# Patient Record
Sex: Male | Born: 1950
Health system: Southern US, Community
[De-identification: ages and names within clinical notes are randomized; demographics above are authoritative.]

## PROBLEM LIST (undated history)

## (undated) DIAGNOSIS — Z972 Presence of dental prosthetic device (complete) (partial): Secondary | ICD-10-CM

## (undated) DIAGNOSIS — E119 Type 2 diabetes mellitus without complications: Secondary | ICD-10-CM

## (undated) DIAGNOSIS — I639 Cerebral infarction, unspecified: Secondary | ICD-10-CM

## (undated) DIAGNOSIS — Z720 Tobacco use: Secondary | ICD-10-CM

## (undated) DIAGNOSIS — I10 Essential (primary) hypertension: Secondary | ICD-10-CM

## (undated) DIAGNOSIS — Z87891 Personal history of nicotine dependence: Secondary | ICD-10-CM

## (undated) HISTORY — DX: Personal history of nicotine dependence: Z87.891

## (undated) HISTORY — DX: Tobacco use: Z72.0

## (undated) HISTORY — PX: FRACTURE SURGERY: SHX138

---

## 2009-08-14 ENCOUNTER — Ambulatory Visit: Payer: Self-pay | Admitting: Internal Medicine

## 2009-08-14 ENCOUNTER — Ambulatory Visit: Payer: Self-pay | Admitting: Cardiology

## 2009-08-14 ENCOUNTER — Inpatient Hospital Stay (HOSPITAL_COMMUNITY): Admission: EM | Admit: 2009-08-14 | Discharge: 2009-08-17 | Payer: Self-pay | Admitting: Emergency Medicine

## 2009-08-15 ENCOUNTER — Encounter: Payer: Self-pay | Admitting: Internal Medicine

## 2009-08-17 ENCOUNTER — Encounter: Payer: Self-pay | Admitting: Internal Medicine

## 2010-05-31 LAB — COMPREHENSIVE METABOLIC PANEL WITH GFR
ALT: 13 U/L (ref 0–53)
AST: 15 U/L (ref 0–37)
Albumin: 3.5 g/dL (ref 3.5–5.2)
Alkaline Phosphatase: 77 U/L (ref 39–117)
BUN: 11 mg/dL (ref 6–23)
CO2: 27 meq/L (ref 19–32)
Calcium: 8.8 mg/dL (ref 8.4–10.5)
Chloride: 103 meq/L (ref 96–112)
Creatinine, Ser: 0.87 mg/dL (ref 0.4–1.5)
GFR calc non Af Amer: >60 mL/min
Glucose, Bld: 123 mg/dL — ABNORMAL HIGH (ref 70–99)
Potassium: 3.5 meq/L (ref 3.5–5.1)
Sodium: 134 meq/L — ABNORMAL LOW (ref 135–145)
Total Bilirubin: 0.6 mg/dL (ref 0.3–1.2)
Total Protein: 6.2 g/dL (ref 6.0–8.3)

## 2010-05-31 LAB — DIFFERENTIAL
Basophils Relative: 0 % (ref 0–1)
Lymphs Abs: 1.8 10*3/uL (ref 0.7–4.0)
Monocytes Absolute: 0.6 10*3/uL (ref 0.1–1.0)
Monocytes Relative: 6 % (ref 3–12)
Neutro Abs: 7.6 10*3/uL (ref 1.7–7.7)

## 2010-05-31 LAB — PROTIME-INR: Prothrombin Time: 13.9 seconds (ref 11.6–15.2)

## 2010-05-31 LAB — COMPREHENSIVE METABOLIC PANEL
Alkaline Phosphatase: 76 U/L (ref 39–117)
BUN: 10 mg/dL (ref 6–23)
Creatinine, Ser: 0.76 mg/dL (ref 0.4–1.5)
GFR calc non Af Amer: >60 mL/min (ref >60)
Potassium: 3.6 mEq/L (ref 3.5–5.1)
Sodium: 142 mEq/L (ref 135–145)
Total Protein: 6.5 g/dL (ref 6.0–8.3)

## 2010-05-31 LAB — URINALYSIS, ROUTINE W REFLEX MICROSCOPIC
Bilirubin Urine: NEGATIVE
Glucose, UA: NEGATIVE mg/dL
Hgb urine dipstick: NEGATIVE
Ketones, ur: NEGATIVE mg/dL
Nitrite: NEGATIVE
Protein, ur: NEGATIVE mg/dL
Specific Gravity, Urine: 1.01 (ref 1.005–1.030)
Urobilinogen, UA: 0.2 mg/dL (ref 0.0–1.0)
pH: 7 (ref 5.0–8.0)

## 2010-05-31 LAB — LIPID PANEL
HDL: 37 mg/dL — ABNORMAL LOW
Total CHOL/HDL Ratio: 4.5 ratio
Triglycerides: 156 mg/dL — ABNORMAL HIGH
VLDL: 31 mg/dL (ref 0–40)

## 2010-05-31 LAB — GLUCOSE, CAPILLARY
Glucose-Capillary: 102 mg/dL — ABNORMAL HIGH (ref 70–99)
Glucose-Capillary: 116 mg/dL — ABNORMAL HIGH (ref 70–99)
Glucose-Capillary: 120 mg/dL — ABNORMAL HIGH (ref 70–99)
Glucose-Capillary: 128 mg/dL — ABNORMAL HIGH (ref 70–99)
Glucose-Capillary: 139 mg/dL — ABNORMAL HIGH (ref 70–99)
Glucose-Capillary: 159 mg/dL — ABNORMAL HIGH (ref 70–99)

## 2010-05-31 LAB — CBC
Hemoglobin: 14.4 g/dL (ref 13.0–17.0)
MCHC: 35 g/dL (ref 30.0–36.0)
MCV: 89.6 fL (ref 78.0–100.0)
RDW: 13.5 % (ref 11.5–15.5)

## 2010-05-31 LAB — TROPONIN I

## 2010-05-31 LAB — CK TOTAL AND CKMB (NOT AT ARMC)
CK, MB: 2.3 ng/mL (ref 0.3–4.0)
Relative Index: 2.2 (ref 0.0–2.5)

## 2010-05-31 LAB — HEMOGLOBIN A1C
Hgb A1c MFr Bld: 7.1 % — ABNORMAL HIGH
Mean Plasma Glucose: 157 mg/dL — ABNORMAL HIGH

## 2010-05-31 LAB — APTT: aPTT: 28 seconds (ref 24–37)

## 2010-05-31 LAB — TSH: TSH: 4.949 u[IU]/mL — ABNORMAL HIGH (ref 0.350–4.500)

## 2011-04-21 ENCOUNTER — Emergency Department: Payer: Self-pay | Admitting: Internal Medicine

## 2011-04-21 LAB — COMPREHENSIVE METABOLIC PANEL
Alkaline Phosphatase: 73 U/L (ref 50–136)
BUN: 11 mg/dL (ref 7–18)
Calcium, Total: 8.8 mg/dL (ref 8.5–10.1)
Co2: 22 mmol/L (ref 21–32)
EGFR (Non-African Amer.): >60
Glucose: 132 mg/dL — ABNORMAL HIGH (ref 65–99)
Osmolality: 271 (ref 275–301)
SGPT (ALT): 28 U/L

## 2011-04-21 LAB — CBC
HCT: 39.5 % — ABNORMAL LOW (ref 40.0–52.0)
MCHC: 34.5 g/dL (ref 32.0–36.0)
MCV: 93 fL (ref 80–100)
Platelet: 185 10*3/uL (ref 150–440)
RDW: 12.4 % (ref 11.5–14.5)

## 2011-04-21 LAB — CK TOTAL AND CKMB (NOT AT ARMC): CK-MB: 4.3 ng/mL — ABNORMAL HIGH (ref 0.5–3.6)

## 2013-08-09 ENCOUNTER — Ambulatory Visit: Payer: Self-pay | Admitting: Unknown Physician Specialty

## 2013-08-12 LAB — PATHOLOGY REPORT

## 2013-12-02 DIAGNOSIS — E1121 Type 2 diabetes mellitus with diabetic nephropathy: Secondary | ICD-10-CM | POA: Insufficient documentation

## 2013-12-02 DIAGNOSIS — R809 Proteinuria, unspecified: Secondary | ICD-10-CM | POA: Insufficient documentation

## 2014-06-11 DIAGNOSIS — E1121 Type 2 diabetes mellitus with diabetic nephropathy: Secondary | ICD-10-CM | POA: Diagnosis not present

## 2014-06-11 DIAGNOSIS — Z0000 Encounter for general adult medical examination without abnormal findings: Secondary | ICD-10-CM | POA: Diagnosis not present

## 2014-06-11 DIAGNOSIS — R312 Other microscopic hematuria: Secondary | ICD-10-CM | POA: Diagnosis not present

## 2014-06-11 DIAGNOSIS — I10 Essential (primary) hypertension: Secondary | ICD-10-CM | POA: Diagnosis not present

## 2014-06-11 DIAGNOSIS — Z125 Encounter for screening for malignant neoplasm of prostate: Secondary | ICD-10-CM | POA: Diagnosis not present

## 2014-06-11 DIAGNOSIS — R809 Proteinuria, unspecified: Secondary | ICD-10-CM | POA: Diagnosis not present

## 2014-06-18 DIAGNOSIS — E1121 Type 2 diabetes mellitus with diabetic nephropathy: Secondary | ICD-10-CM | POA: Diagnosis not present

## 2014-06-18 DIAGNOSIS — R809 Proteinuria, unspecified: Secondary | ICD-10-CM | POA: Diagnosis not present

## 2014-06-18 DIAGNOSIS — I10 Essential (primary) hypertension: Secondary | ICD-10-CM | POA: Diagnosis not present

## 2014-06-18 DIAGNOSIS — R312 Other microscopic hematuria: Secondary | ICD-10-CM | POA: Diagnosis not present

## 2014-06-18 DIAGNOSIS — L409 Psoriasis, unspecified: Secondary | ICD-10-CM | POA: Diagnosis not present

## 2014-06-18 DIAGNOSIS — Z720 Tobacco use: Secondary | ICD-10-CM | POA: Diagnosis not present

## 2014-06-18 DIAGNOSIS — I639 Cerebral infarction, unspecified: Secondary | ICD-10-CM | POA: Diagnosis not present

## 2014-07-06 NOTE — Op Note (Signed)
PATIENT NAME:  Phillip Kent, Phillip Kent MR#:  [id number] DATE OF BIRTH:  Apr 04, 1950  DATE OF PROCEDURE:  04/21/2011  PREOPERATIVE DIAGNOSIS: Anterior left shoulder dislocation with several fragments of bone off the greater tuberosity.   POSTOPERATIVE DIAGNOSIS: Anterior left shoulder dislocation with several fragments of bone off the greater tuberosity.   PROCEDURE: Closed reduction of anterior dislocation, left shoulder.   SURGEON: Christophe Louis, M.D.   ANESTHESIA: IV.   PROCEDURE: This procedure was performed in the Emergency Room. The patient had previously been given intravenous sedation and analgesia by the Emergency Room physicians. Several previous attempts at closed reduction were unsuccessful. His x-ray demonstrates an anterior dislocation of the humeral head with several bone fragments which presumably are coming off of the greater tuberosity. I discussed the nature of the procedure fully with the patient and his wife, who is present. The shoulder dislocation was reduced with longitudinal traction, external rotation and then adduction. At some point during this maneuver, the humeral head reduced with a palpable clunk and the previous deformity was absent. Shoulder immobilizer was applied with the arm in full internal rotation. The patient was sent to the Emergency Room for follow-up x-ray film following closed reduction. The patient tolerated the procedure quite well. He is to follow-up in five days in my office and is not to externally rotate the arm at all.   ____________________________ Lucas Mallow, MD ces:ap D: 04/21/2011 13:24:07 ET T: 04/21/2011 13:30:42 ET JOB#: [id number]  cc: Lucas Mallow, MD, <Dictator> Lucas Mallow MD ELECTRONICALLY SIGNED 04/21/2011 13:46

## 2014-07-23 ENCOUNTER — Other Ambulatory Visit: Payer: Self-pay | Admitting: Family Medicine

## 2014-07-23 ENCOUNTER — Encounter: Payer: Self-pay | Admitting: Family Medicine

## 2014-07-23 DIAGNOSIS — Z720 Tobacco use: Secondary | ICD-10-CM

## 2014-07-23 HISTORY — DX: Tobacco use: Z72.0

## 2014-07-25 ENCOUNTER — Encounter: Payer: Self-pay | Admitting: Family Medicine

## 2014-07-25 ENCOUNTER — Inpatient Hospital Stay: Payer: Commercial Managed Care - HMO | Attending: Family Medicine | Admitting: Family Medicine

## 2014-07-25 ENCOUNTER — Ambulatory Visit
Admission: RE | Admit: 2014-07-25 | Discharge: 2014-07-25 | Disposition: A | Discharge status: Home / Self Care | Payer: Commercial Managed Care - HMO | Source: Ambulatory Visit | Attending: Family Medicine | Admitting: Family Medicine

## 2014-07-25 DIAGNOSIS — J439 Emphysema, unspecified: Secondary | ICD-10-CM | POA: Diagnosis not present

## 2014-07-25 DIAGNOSIS — Z720 Tobacco use: Secondary | ICD-10-CM

## 2014-07-25 DIAGNOSIS — F17200 Nicotine dependence, unspecified, uncomplicated: Secondary | ICD-10-CM | POA: Diagnosis not present

## 2014-07-25 DIAGNOSIS — I2510 Atherosclerotic heart disease of native coronary artery without angina pectoris: Secondary | ICD-10-CM | POA: Diagnosis not present

## 2014-07-25 DIAGNOSIS — F17210 Nicotine dependence, cigarettes, uncomplicated: Secondary | ICD-10-CM

## 2014-07-25 DIAGNOSIS — Z122 Encounter for screening for malignant neoplasm of respiratory organs: Secondary | ICD-10-CM

## 2014-07-25 DIAGNOSIS — Z87891 Personal history of nicotine dependence: Secondary | ICD-10-CM | POA: Diagnosis not present

## 2014-07-25 NOTE — Progress Notes (Signed)
In accordance with CMS guidelines, patient has meet eligibility criteria including age, absence of signs or symptoms of lung cancer, the specific calculation of cigarette smoking pack-years was 30 years and is a current smoker.   A shared decision-making session was been conducted prior to the performance of CT scan. This includes one or more decision aids, includes benefits and harms of screening, follow-up diagnostic testing, over-diagnosis, false positive rate, and total radiation exposure.  Counseling on the importance of adherence to annual lung cancer LDCT screening, impact of co-morbidities, and ability or willingness to undergo diagnosis and treatment is imperative for compliance of the program.  Counseling on the importance of continued smoking cessation for former smokers; the importance of smoking cessation for current smokers and information about tobacco cessation interventions have been given to patient including the [place] at [facility], [address] quit [place], as well as [place] specific smoking cessation programs.  Written order for lung cancer screening with LDCT has been given to the patient and any and all questions have been answered to the best of my abilities.   Yearly follow up will be scheduled by Burgess Estelle, Thoracic Navigator.

## 2014-07-28 ENCOUNTER — Telehealth: Payer: Self-pay | Admitting: *Deleted

## 2014-07-28 NOTE — Telephone Encounter (Signed)
Notified patient of LDCT lung cancer screening results of Lung [place] 1 finding with recommendation for 12 month follow up imaging. Also notified of incidental finding of COPD and coronary artery atherosclerotic changes. Will send message to referring provider.

## 2014-12-11 DIAGNOSIS — R809 Proteinuria, unspecified: Secondary | ICD-10-CM | POA: Diagnosis not present

## 2014-12-11 DIAGNOSIS — E1121 Type 2 diabetes mellitus with diabetic nephropathy: Secondary | ICD-10-CM | POA: Diagnosis not present

## 2014-12-11 DIAGNOSIS — I639 Cerebral infarction, unspecified: Secondary | ICD-10-CM | POA: Diagnosis not present

## 2014-12-11 DIAGNOSIS — I10 Essential (primary) hypertension: Secondary | ICD-10-CM | POA: Diagnosis not present

## 2014-12-11 DIAGNOSIS — R312 Other microscopic hematuria: Secondary | ICD-10-CM | POA: Diagnosis not present

## 2014-12-23 DIAGNOSIS — L409 Psoriasis, unspecified: Secondary | ICD-10-CM | POA: Diagnosis not present

## 2014-12-23 DIAGNOSIS — E1121 Type 2 diabetes mellitus with diabetic nephropathy: Secondary | ICD-10-CM | POA: Diagnosis not present

## 2014-12-23 DIAGNOSIS — I638 Other cerebral infarction: Secondary | ICD-10-CM | POA: Diagnosis not present

## 2014-12-23 DIAGNOSIS — I10 Essential (primary) hypertension: Secondary | ICD-10-CM | POA: Diagnosis not present

## 2014-12-23 DIAGNOSIS — Z720 Tobacco use: Secondary | ICD-10-CM | POA: Diagnosis not present

## 2014-12-23 DIAGNOSIS — R3129 Other microscopic hematuria: Secondary | ICD-10-CM | POA: Diagnosis not present

## 2014-12-23 DIAGNOSIS — Z23 Encounter for immunization: Secondary | ICD-10-CM | POA: Diagnosis not present

## 2014-12-23 DIAGNOSIS — R809 Proteinuria, unspecified: Secondary | ICD-10-CM | POA: Diagnosis not present

## 2015-02-24 DIAGNOSIS — Z8673 Personal history of transient ischemic attack (TIA), and cerebral infarction without residual deficits: Secondary | ICD-10-CM | POA: Insufficient documentation

## 2015-04-01 DIAGNOSIS — R531 Weakness: Secondary | ICD-10-CM | POA: Diagnosis not present

## 2015-04-01 DIAGNOSIS — F1010 Alcohol abuse, uncomplicated: Secondary | ICD-10-CM | POA: Diagnosis not present

## 2015-06-12 DIAGNOSIS — L409 Psoriasis, unspecified: Secondary | ICD-10-CM | POA: Diagnosis not present

## 2015-06-12 DIAGNOSIS — E1121 Type 2 diabetes mellitus with diabetic nephropathy: Secondary | ICD-10-CM | POA: Diagnosis not present

## 2015-06-12 DIAGNOSIS — Z125 Encounter for screening for malignant neoplasm of prostate: Secondary | ICD-10-CM | POA: Diagnosis not present

## 2015-06-12 DIAGNOSIS — I10 Essential (primary) hypertension: Secondary | ICD-10-CM | POA: Diagnosis not present

## 2015-06-12 DIAGNOSIS — I638 Other cerebral infarction: Secondary | ICD-10-CM | POA: Diagnosis not present

## 2015-06-19 ENCOUNTER — Other Ambulatory Visit: Payer: Self-pay | Admitting: Internal Medicine

## 2015-06-19 DIAGNOSIS — I10 Essential (primary) hypertension: Secondary | ICD-10-CM | POA: Diagnosis not present

## 2015-06-19 DIAGNOSIS — D6489 Other specified anemias: Secondary | ICD-10-CM | POA: Diagnosis not present

## 2015-06-19 DIAGNOSIS — E1121 Type 2 diabetes mellitus with diabetic nephropathy: Secondary | ICD-10-CM | POA: Diagnosis not present

## 2015-06-19 DIAGNOSIS — Z720 Tobacco use: Secondary | ICD-10-CM | POA: Diagnosis not present

## 2015-06-19 DIAGNOSIS — L409 Psoriasis, unspecified: Secondary | ICD-10-CM | POA: Diagnosis not present

## 2015-06-19 DIAGNOSIS — Z8673 Personal history of transient ischemic attack (TIA), and cerebral infarction without residual deficits: Secondary | ICD-10-CM | POA: Diagnosis not present

## 2015-06-19 DIAGNOSIS — R808 Other proteinuria: Secondary | ICD-10-CM | POA: Diagnosis not present

## 2015-06-19 DIAGNOSIS — R3129 Other microscopic hematuria: Secondary | ICD-10-CM | POA: Diagnosis not present

## 2015-06-19 DIAGNOSIS — R1013 Epigastric pain: Secondary | ICD-10-CM

## 2015-06-19 DIAGNOSIS — Z23 Encounter for immunization: Secondary | ICD-10-CM | POA: Diagnosis not present

## 2015-06-24 ENCOUNTER — Ambulatory Visit
Admission: RE | Admit: 2015-06-24 | Discharge: 2015-06-24 | Disposition: A | Discharge status: Home / Self Care | Payer: Commercial Managed Care - HMO | Source: Ambulatory Visit | Attending: Internal Medicine | Admitting: Internal Medicine

## 2015-06-24 DIAGNOSIS — K824 Cholesterolosis of gallbladder: Secondary | ICD-10-CM | POA: Insufficient documentation

## 2015-06-24 DIAGNOSIS — R1013 Epigastric pain: Secondary | ICD-10-CM

## 2015-06-24 DIAGNOSIS — K760 Fatty (change of) liver, not elsewhere classified: Secondary | ICD-10-CM | POA: Insufficient documentation

## 2015-07-13 ENCOUNTER — Telehealth: Payer: Self-pay | Admitting: *Deleted

## 2015-07-13 DIAGNOSIS — D6489 Other specified anemias: Secondary | ICD-10-CM | POA: Diagnosis not present

## 2015-07-13 DIAGNOSIS — D649 Anemia, unspecified: Secondary | ICD-10-CM | POA: Diagnosis not present

## 2015-07-13 NOTE — Telephone Encounter (Signed)
Notified patient that his annual  lung cancer screening low dose CT scan is due. Confirmed that patient is within age range of 55-77, asymptomatic of lung cancer, and no other serious disease processes that would make treatment of lung cancer not possible. The patient is a  former smoker with quit date of 2013  with a 49 pack year history. Patient admits to casually smoking a cigarette here and there. The shared decision making visit was completed 07/31/2104. The patient is agreeable for CT scan to be scheduled.

## 2015-07-17 ENCOUNTER — Other Ambulatory Visit: Payer: Self-pay | Admitting: Family Medicine

## 2015-07-17 ENCOUNTER — Encounter: Payer: Self-pay | Admitting: Family Medicine

## 2015-07-17 DIAGNOSIS — Z87891 Personal history of nicotine dependence: Secondary | ICD-10-CM

## 2015-07-17 HISTORY — DX: Personal history of nicotine dependence: Z87.891

## 2015-07-20 DIAGNOSIS — I10 Essential (primary) hypertension: Secondary | ICD-10-CM | POA: Diagnosis not present

## 2015-07-20 DIAGNOSIS — Z8673 Personal history of transient ischemic attack (TIA), and cerebral infarction without residual deficits: Secondary | ICD-10-CM | POA: Diagnosis not present

## 2015-07-20 DIAGNOSIS — D6489 Other specified anemias: Secondary | ICD-10-CM | POA: Diagnosis not present

## 2015-07-20 DIAGNOSIS — E1121 Type 2 diabetes mellitus with diabetic nephropathy: Secondary | ICD-10-CM | POA: Diagnosis not present

## 2015-07-20 DIAGNOSIS — R808 Other proteinuria: Secondary | ICD-10-CM | POA: Diagnosis not present

## 2015-07-20 DIAGNOSIS — Z720 Tobacco use: Secondary | ICD-10-CM | POA: Diagnosis not present

## 2015-07-27 ENCOUNTER — Ambulatory Visit
Admission: RE | Admit: 2015-07-27 | Discharge: 2015-07-27 | Disposition: A | Discharge status: Home / Self Care | Payer: Commercial Managed Care - HMO | Source: Ambulatory Visit | Attending: Family Medicine | Admitting: Family Medicine

## 2015-07-27 DIAGNOSIS — I2510 Atherosclerotic heart disease of native coronary artery without angina pectoris: Secondary | ICD-10-CM | POA: Insufficient documentation

## 2015-07-27 DIAGNOSIS — Z87891 Personal history of nicotine dependence: Secondary | ICD-10-CM | POA: Insufficient documentation

## 2015-08-04 ENCOUNTER — Telehealth: Payer: Self-pay | Admitting: *Deleted

## 2015-08-04 NOTE — Telephone Encounter (Signed)
error 

## 2015-12-09 DIAGNOSIS — E119 Type 2 diabetes mellitus without complications: Secondary | ICD-10-CM | POA: Diagnosis not present

## 2016-01-11 DIAGNOSIS — D6489 Other specified anemias: Secondary | ICD-10-CM | POA: Diagnosis not present

## 2016-01-11 DIAGNOSIS — E1121 Type 2 diabetes mellitus with diabetic nephropathy: Secondary | ICD-10-CM | POA: Diagnosis not present

## 2016-01-11 DIAGNOSIS — I10 Essential (primary) hypertension: Secondary | ICD-10-CM | POA: Diagnosis not present

## 2016-01-18 DIAGNOSIS — E1121 Type 2 diabetes mellitus with diabetic nephropathy: Secondary | ICD-10-CM | POA: Diagnosis not present

## 2016-01-18 DIAGNOSIS — Z125 Encounter for screening for malignant neoplasm of prostate: Secondary | ICD-10-CM | POA: Diagnosis not present

## 2016-01-18 DIAGNOSIS — Z8673 Personal history of transient ischemic attack (TIA), and cerebral infarction without residual deficits: Secondary | ICD-10-CM | POA: Diagnosis not present

## 2016-01-18 DIAGNOSIS — R808 Other proteinuria: Secondary | ICD-10-CM | POA: Diagnosis not present

## 2016-01-18 DIAGNOSIS — Z23 Encounter for immunization: Secondary | ICD-10-CM | POA: Diagnosis not present

## 2016-01-18 DIAGNOSIS — I10 Essential (primary) hypertension: Secondary | ICD-10-CM | POA: Diagnosis not present

## 2016-01-18 DIAGNOSIS — Z720 Tobacco use: Secondary | ICD-10-CM | POA: Diagnosis not present

## 2016-01-18 DIAGNOSIS — R3129 Other microscopic hematuria: Secondary | ICD-10-CM | POA: Diagnosis not present

## 2016-07-11 DIAGNOSIS — E1121 Type 2 diabetes mellitus with diabetic nephropathy: Secondary | ICD-10-CM | POA: Diagnosis not present

## 2016-07-11 DIAGNOSIS — I10 Essential (primary) hypertension: Secondary | ICD-10-CM | POA: Diagnosis not present

## 2016-07-11 DIAGNOSIS — R3129 Other microscopic hematuria: Secondary | ICD-10-CM | POA: Diagnosis not present

## 2016-07-11 DIAGNOSIS — Z125 Encounter for screening for malignant neoplasm of prostate: Secondary | ICD-10-CM | POA: Diagnosis not present

## 2016-07-12 ENCOUNTER — Telehealth: Payer: Self-pay | Admitting: *Deleted

## 2016-07-12 DIAGNOSIS — Z87891 Personal history of nicotine dependence: Secondary | ICD-10-CM

## 2016-07-12 NOTE — Telephone Encounter (Signed)
Notified patient that annual lung cancer screening low dose CT scan is due. Confirmed that patient is within the age range of 55-77, and asymptomatic, (no signs or symptoms of lung cancer). Patient denies illness that would prevent curative treatment for lung cancer if found. The patient is a former smoker quit 06/12/16, with a 30 pack year history. The shared decision making visit was done 07/25/14. Patient is agreeable for CT scan being scheduled.

## 2016-07-18 DIAGNOSIS — I10 Essential (primary) hypertension: Secondary | ICD-10-CM | POA: Diagnosis not present

## 2016-07-18 DIAGNOSIS — R3129 Other microscopic hematuria: Secondary | ICD-10-CM | POA: Diagnosis not present

## 2016-07-18 DIAGNOSIS — Z0000 Encounter for general adult medical examination without abnormal findings: Secondary | ICD-10-CM | POA: Diagnosis not present

## 2016-07-18 DIAGNOSIS — E1121 Type 2 diabetes mellitus with diabetic nephropathy: Secondary | ICD-10-CM | POA: Diagnosis not present

## 2016-07-18 DIAGNOSIS — I2510 Atherosclerotic heart disease of native coronary artery without angina pectoris: Secondary | ICD-10-CM | POA: Diagnosis not present

## 2016-07-18 DIAGNOSIS — L409 Psoriasis, unspecified: Secondary | ICD-10-CM | POA: Diagnosis not present

## 2016-07-18 DIAGNOSIS — Z720 Tobacco use: Secondary | ICD-10-CM | POA: Diagnosis not present

## 2016-07-18 DIAGNOSIS — R808 Other proteinuria: Secondary | ICD-10-CM | POA: Diagnosis not present

## 2016-07-18 DIAGNOSIS — Z8673 Personal history of transient ischemic attack (TIA), and cerebral infarction without residual deficits: Secondary | ICD-10-CM | POA: Diagnosis not present

## 2016-07-26 ENCOUNTER — Ambulatory Visit
Admission: RE | Admit: 2016-07-26 | Discharge: 2016-07-26 | Disposition: A | Discharge status: Home / Self Care | Payer: Medicare HMO | Source: Ambulatory Visit | Attending: Oncology | Admitting: Oncology

## 2016-07-26 DIAGNOSIS — I700 Atherosclerosis of aorta: Secondary | ICD-10-CM | POA: Diagnosis not present

## 2016-07-26 DIAGNOSIS — I2510 Atherosclerotic heart disease of native coronary artery without angina pectoris: Secondary | ICD-10-CM | POA: Diagnosis not present

## 2016-07-26 DIAGNOSIS — Z122 Encounter for screening for malignant neoplasm of respiratory organs: Secondary | ICD-10-CM | POA: Insufficient documentation

## 2016-07-26 DIAGNOSIS — Z87891 Personal history of nicotine dependence: Secondary | ICD-10-CM | POA: Insufficient documentation

## 2016-08-01 ENCOUNTER — Encounter: Payer: Self-pay | Admitting: *Deleted

## 2016-08-04 DIAGNOSIS — I700 Atherosclerosis of aorta: Secondary | ICD-10-CM | POA: Insufficient documentation

## 2017-01-12 DIAGNOSIS — E1121 Type 2 diabetes mellitus with diabetic nephropathy: Secondary | ICD-10-CM | POA: Diagnosis not present

## 2017-01-12 DIAGNOSIS — L409 Psoriasis, unspecified: Secondary | ICD-10-CM | POA: Diagnosis not present

## 2017-01-12 DIAGNOSIS — I10 Essential (primary) hypertension: Secondary | ICD-10-CM | POA: Diagnosis not present

## 2017-01-12 DIAGNOSIS — R3129 Other microscopic hematuria: Secondary | ICD-10-CM | POA: Diagnosis not present

## 2017-01-12 DIAGNOSIS — I2510 Atherosclerotic heart disease of native coronary artery without angina pectoris: Secondary | ICD-10-CM | POA: Diagnosis not present

## 2017-01-19 DIAGNOSIS — E1121 Type 2 diabetes mellitus with diabetic nephropathy: Secondary | ICD-10-CM | POA: Diagnosis not present

## 2017-01-19 DIAGNOSIS — Z23 Encounter for immunization: Secondary | ICD-10-CM | POA: Diagnosis not present

## 2017-01-19 DIAGNOSIS — I2510 Atherosclerotic heart disease of native coronary artery without angina pectoris: Secondary | ICD-10-CM | POA: Diagnosis not present

## 2017-01-19 DIAGNOSIS — Z125 Encounter for screening for malignant neoplasm of prostate: Secondary | ICD-10-CM | POA: Diagnosis not present

## 2017-01-19 DIAGNOSIS — E1169 Type 2 diabetes mellitus with other specified complication: Secondary | ICD-10-CM | POA: Insufficient documentation

## 2017-01-19 DIAGNOSIS — Z720 Tobacco use: Secondary | ICD-10-CM | POA: Diagnosis not present

## 2017-01-19 DIAGNOSIS — I700 Atherosclerosis of aorta: Secondary | ICD-10-CM | POA: Diagnosis not present

## 2017-01-19 DIAGNOSIS — R3129 Other microscopic hematuria: Secondary | ICD-10-CM | POA: Diagnosis not present

## 2017-01-19 DIAGNOSIS — I10 Essential (primary) hypertension: Secondary | ICD-10-CM | POA: Diagnosis not present

## 2017-01-19 DIAGNOSIS — B351 Tinea unguium: Secondary | ICD-10-CM | POA: Insufficient documentation

## 2017-01-19 DIAGNOSIS — Z8673 Personal history of transient ischemic attack (TIA), and cerebral infarction without residual deficits: Secondary | ICD-10-CM | POA: Diagnosis not present

## 2017-02-14 DIAGNOSIS — E119 Type 2 diabetes mellitus without complications: Secondary | ICD-10-CM | POA: Diagnosis not present

## 2017-07-18 DIAGNOSIS — I10 Essential (primary) hypertension: Secondary | ICD-10-CM | POA: Diagnosis not present

## 2017-07-18 DIAGNOSIS — Z125 Encounter for screening for malignant neoplasm of prostate: Secondary | ICD-10-CM | POA: Diagnosis not present

## 2017-07-18 DIAGNOSIS — E1121 Type 2 diabetes mellitus with diabetic nephropathy: Secondary | ICD-10-CM | POA: Diagnosis not present

## 2017-07-18 DIAGNOSIS — I700 Atherosclerosis of aorta: Secondary | ICD-10-CM | POA: Diagnosis not present

## 2017-07-18 DIAGNOSIS — I2510 Atherosclerotic heart disease of native coronary artery without angina pectoris: Secondary | ICD-10-CM | POA: Diagnosis not present

## 2017-07-18 DIAGNOSIS — E785 Hyperlipidemia, unspecified: Secondary | ICD-10-CM | POA: Diagnosis not present

## 2017-07-18 DIAGNOSIS — R3129 Other microscopic hematuria: Secondary | ICD-10-CM | POA: Diagnosis not present

## 2017-07-25 DIAGNOSIS — E1169 Type 2 diabetes mellitus with other specified complication: Secondary | ICD-10-CM | POA: Diagnosis not present

## 2017-07-25 DIAGNOSIS — E1121 Type 2 diabetes mellitus with diabetic nephropathy: Secondary | ICD-10-CM | POA: Diagnosis not present

## 2017-07-25 DIAGNOSIS — Z720 Tobacco use: Secondary | ICD-10-CM | POA: Diagnosis not present

## 2017-07-25 DIAGNOSIS — I10 Essential (primary) hypertension: Secondary | ICD-10-CM | POA: Diagnosis not present

## 2017-07-25 DIAGNOSIS — Z0000 Encounter for general adult medical examination without abnormal findings: Secondary | ICD-10-CM | POA: Diagnosis not present

## 2017-07-25 DIAGNOSIS — R3129 Other microscopic hematuria: Secondary | ICD-10-CM | POA: Diagnosis not present

## 2017-07-25 DIAGNOSIS — I700 Atherosclerosis of aorta: Secondary | ICD-10-CM | POA: Diagnosis not present

## 2017-07-25 DIAGNOSIS — I2510 Atherosclerotic heart disease of native coronary artery without angina pectoris: Secondary | ICD-10-CM | POA: Diagnosis not present

## 2017-07-25 DIAGNOSIS — L409 Psoriasis, unspecified: Secondary | ICD-10-CM | POA: Diagnosis not present

## 2017-07-25 DIAGNOSIS — R42 Dizziness and giddiness: Secondary | ICD-10-CM | POA: Diagnosis not present

## 2017-07-27 ENCOUNTER — Telehealth: Payer: Self-pay | Admitting: *Deleted

## 2017-07-27 DIAGNOSIS — Z122 Encounter for screening for malignant neoplasm of respiratory organs: Secondary | ICD-10-CM

## 2017-07-27 DIAGNOSIS — Z87891 Personal history of nicotine dependence: Secondary | ICD-10-CM

## 2017-07-27 NOTE — Telephone Encounter (Signed)
Notified patient that annual lung cancer screening low dose CT scan is due currently or will be in near future. Confirmed that patient is within the age range of 55-77, and asymptomatic, (no signs or symptoms of lung cancer). Patient denies illness that would prevent curative treatment for lung cancer if found. Verified smoking history, (former, quit 06/12/16, 30 pack year). The shared decision making visit was done 07/25/14. Patient is agreeable for CT scan being scheduled.

## 2017-08-02 ENCOUNTER — Ambulatory Visit
Admission: RE | Admit: 2017-08-02 | Discharge: 2017-08-02 | Disposition: A | Discharge status: Home / Self Care | Payer: Medicare HMO | Source: Ambulatory Visit | Attending: Nurse Practitioner | Admitting: Nurse Practitioner

## 2017-08-02 DIAGNOSIS — I700 Atherosclerosis of aorta: Secondary | ICD-10-CM | POA: Diagnosis not present

## 2017-08-02 DIAGNOSIS — R918 Other nonspecific abnormal finding of lung field: Secondary | ICD-10-CM | POA: Insufficient documentation

## 2017-08-02 DIAGNOSIS — Z87891 Personal history of nicotine dependence: Secondary | ICD-10-CM | POA: Diagnosis not present

## 2017-08-02 DIAGNOSIS — J439 Emphysema, unspecified: Secondary | ICD-10-CM | POA: Diagnosis not present

## 2017-08-02 DIAGNOSIS — Z122 Encounter for screening for malignant neoplasm of respiratory organs: Secondary | ICD-10-CM | POA: Diagnosis not present

## 2017-08-02 DIAGNOSIS — I2510 Atherosclerotic heart disease of native coronary artery without angina pectoris: Secondary | ICD-10-CM | POA: Diagnosis not present

## 2017-08-04 ENCOUNTER — Encounter: Payer: Self-pay | Admitting: *Deleted

## 2018-01-16 DIAGNOSIS — E785 Hyperlipidemia, unspecified: Secondary | ICD-10-CM | POA: Diagnosis not present

## 2018-01-16 DIAGNOSIS — I700 Atherosclerosis of aorta: Secondary | ICD-10-CM | POA: Diagnosis not present

## 2018-01-16 DIAGNOSIS — I10 Essential (primary) hypertension: Secondary | ICD-10-CM | POA: Diagnosis not present

## 2018-01-16 DIAGNOSIS — I2510 Atherosclerotic heart disease of native coronary artery without angina pectoris: Secondary | ICD-10-CM | POA: Diagnosis not present

## 2018-01-16 DIAGNOSIS — E1121 Type 2 diabetes mellitus with diabetic nephropathy: Secondary | ICD-10-CM | POA: Diagnosis not present

## 2018-01-23 DIAGNOSIS — I700 Atherosclerosis of aorta: Secondary | ICD-10-CM | POA: Diagnosis not present

## 2018-01-23 DIAGNOSIS — I2510 Atherosclerotic heart disease of native coronary artery without angina pectoris: Secondary | ICD-10-CM | POA: Diagnosis not present

## 2018-01-23 DIAGNOSIS — R3129 Other microscopic hematuria: Secondary | ICD-10-CM | POA: Diagnosis not present

## 2018-01-23 DIAGNOSIS — Z720 Tobacco use: Secondary | ICD-10-CM | POA: Diagnosis not present

## 2018-01-23 DIAGNOSIS — Z23 Encounter for immunization: Secondary | ICD-10-CM | POA: Diagnosis not present

## 2018-01-23 DIAGNOSIS — E1169 Type 2 diabetes mellitus with other specified complication: Secondary | ICD-10-CM | POA: Diagnosis not present

## 2018-01-23 DIAGNOSIS — R808 Other proteinuria: Secondary | ICD-10-CM | POA: Diagnosis not present

## 2018-01-23 DIAGNOSIS — E1121 Type 2 diabetes mellitus with diabetic nephropathy: Secondary | ICD-10-CM | POA: Diagnosis not present

## 2018-01-23 DIAGNOSIS — Z8673 Personal history of transient ischemic attack (TIA), and cerebral infarction without residual deficits: Secondary | ICD-10-CM | POA: Diagnosis not present

## 2018-01-23 DIAGNOSIS — I10 Essential (primary) hypertension: Secondary | ICD-10-CM | POA: Diagnosis not present

## 2018-02-07 DIAGNOSIS — D649 Anemia, unspecified: Secondary | ICD-10-CM | POA: Diagnosis not present

## 2018-03-21 DIAGNOSIS — I700 Atherosclerosis of aorta: Secondary | ICD-10-CM | POA: Diagnosis not present

## 2018-03-21 DIAGNOSIS — R3129 Other microscopic hematuria: Secondary | ICD-10-CM | POA: Diagnosis not present

## 2018-03-21 DIAGNOSIS — R808 Other proteinuria: Secondary | ICD-10-CM | POA: Diagnosis not present

## 2018-03-21 DIAGNOSIS — B351 Tinea unguium: Secondary | ICD-10-CM | POA: Diagnosis not present

## 2018-03-21 DIAGNOSIS — Z8673 Personal history of transient ischemic attack (TIA), and cerebral infarction without residual deficits: Secondary | ICD-10-CM | POA: Diagnosis not present

## 2018-03-21 DIAGNOSIS — I10 Essential (primary) hypertension: Secondary | ICD-10-CM | POA: Diagnosis not present

## 2018-03-21 DIAGNOSIS — E1169 Type 2 diabetes mellitus with other specified complication: Secondary | ICD-10-CM | POA: Diagnosis not present

## 2018-03-21 DIAGNOSIS — E1121 Type 2 diabetes mellitus with diabetic nephropathy: Secondary | ICD-10-CM | POA: Diagnosis not present

## 2018-03-21 DIAGNOSIS — Z87891 Personal history of nicotine dependence: Secondary | ICD-10-CM | POA: Diagnosis not present

## 2018-03-23 DIAGNOSIS — D649 Anemia, unspecified: Secondary | ICD-10-CM | POA: Diagnosis not present

## 2018-03-29 DIAGNOSIS — E119 Type 2 diabetes mellitus without complications: Secondary | ICD-10-CM | POA: Diagnosis not present

## 2018-05-25 DIAGNOSIS — R69 Illness, unspecified: Secondary | ICD-10-CM | POA: Diagnosis not present

## 2018-06-15 ENCOUNTER — Encounter: Payer: Self-pay | Admitting: *Deleted

## 2018-07-03 DIAGNOSIS — K006 Disturbances in tooth eruption: Secondary | ICD-10-CM | POA: Diagnosis not present

## 2018-07-20 DIAGNOSIS — E1121 Type 2 diabetes mellitus with diabetic nephropathy: Secondary | ICD-10-CM | POA: Diagnosis not present

## 2018-07-20 DIAGNOSIS — Z125 Encounter for screening for malignant neoplasm of prostate: Secondary | ICD-10-CM | POA: Diagnosis not present

## 2018-07-20 DIAGNOSIS — E785 Hyperlipidemia, unspecified: Secondary | ICD-10-CM | POA: Diagnosis not present

## 2018-07-20 DIAGNOSIS — I10 Essential (primary) hypertension: Secondary | ICD-10-CM | POA: Diagnosis not present

## 2018-07-20 DIAGNOSIS — D649 Anemia, unspecified: Secondary | ICD-10-CM | POA: Diagnosis not present

## 2018-07-27 DIAGNOSIS — Z720 Tobacco use: Secondary | ICD-10-CM | POA: Diagnosis not present

## 2018-07-27 DIAGNOSIS — E1121 Type 2 diabetes mellitus with diabetic nephropathy: Secondary | ICD-10-CM | POA: Diagnosis not present

## 2018-07-27 DIAGNOSIS — R808 Other proteinuria: Secondary | ICD-10-CM | POA: Diagnosis not present

## 2018-07-27 DIAGNOSIS — Z0000 Encounter for general adult medical examination without abnormal findings: Secondary | ICD-10-CM | POA: Diagnosis not present

## 2018-07-27 DIAGNOSIS — I700 Atherosclerosis of aorta: Secondary | ICD-10-CM | POA: Diagnosis not present

## 2018-07-27 DIAGNOSIS — I10 Essential (primary) hypertension: Secondary | ICD-10-CM | POA: Diagnosis not present

## 2018-07-27 DIAGNOSIS — E1169 Type 2 diabetes mellitus with other specified complication: Secondary | ICD-10-CM | POA: Diagnosis not present

## 2018-07-27 DIAGNOSIS — R3129 Other microscopic hematuria: Secondary | ICD-10-CM | POA: Diagnosis not present

## 2018-07-27 DIAGNOSIS — L409 Psoriasis, unspecified: Secondary | ICD-10-CM | POA: Diagnosis not present

## 2018-08-24 ENCOUNTER — Telehealth: Payer: Self-pay

## 2018-08-24 ENCOUNTER — Telehealth: Payer: Self-pay | Admitting: *Deleted

## 2018-08-24 DIAGNOSIS — Z87891 Personal history of nicotine dependence: Secondary | ICD-10-CM

## 2018-08-24 DIAGNOSIS — Z122 Encounter for screening for malignant neoplasm of respiratory organs: Secondary | ICD-10-CM

## 2018-08-24 NOTE — Telephone Encounter (Signed)
Patient has been notified that annual lung cancer screening low dose CT scan is due currently or will be in near future. Confirmed that patient is within the age range of 55-77, and asymptomatic, (no signs or symptoms of lung cancer). Patient denies illness that would prevent curative treatment for lung cancer if found. Verified smoking history, (former, 30 pack year, quit 06/12/16). The shared decision making visit was done 07/25/14. Patient is agreeable for CT scan being scheduled.

## 2018-08-24 NOTE — Telephone Encounter (Signed)
Call pt regarding lung screening.  Pt is a former smoker. Pt denies any new health issues.  Pt wants any appointment in about 2 weeks in the afternoon.

## 2018-09-11 ENCOUNTER — Ambulatory Visit
Admission: RE | Admit: 2018-09-11 | Discharge: 2018-09-11 | Disposition: A | Discharge status: Home / Self Care | Payer: Medicare HMO | Source: Ambulatory Visit | Attending: Oncology | Admitting: Oncology

## 2018-09-11 ENCOUNTER — Other Ambulatory Visit: Payer: Self-pay

## 2018-09-11 DIAGNOSIS — Z87891 Personal history of nicotine dependence: Secondary | ICD-10-CM | POA: Insufficient documentation

## 2018-09-11 DIAGNOSIS — Z122 Encounter for screening for malignant neoplasm of respiratory organs: Secondary | ICD-10-CM | POA: Diagnosis not present

## 2018-09-12 ENCOUNTER — Encounter: Payer: Self-pay | Admitting: *Deleted

## 2019-01-22 DIAGNOSIS — E1121 Type 2 diabetes mellitus with diabetic nephropathy: Secondary | ICD-10-CM | POA: Diagnosis not present

## 2019-01-22 DIAGNOSIS — R972 Elevated prostate specific antigen [PSA]: Secondary | ICD-10-CM | POA: Diagnosis not present

## 2019-01-22 DIAGNOSIS — I10 Essential (primary) hypertension: Secondary | ICD-10-CM | POA: Diagnosis not present

## 2019-01-22 DIAGNOSIS — R3129 Other microscopic hematuria: Secondary | ICD-10-CM | POA: Diagnosis not present

## 2019-01-22 DIAGNOSIS — L409 Psoriasis, unspecified: Secondary | ICD-10-CM | POA: Diagnosis not present

## 2019-01-22 DIAGNOSIS — I700 Atherosclerosis of aorta: Secondary | ICD-10-CM | POA: Diagnosis not present

## 2019-01-29 DIAGNOSIS — Z23 Encounter for immunization: Secondary | ICD-10-CM | POA: Diagnosis not present

## 2019-01-29 DIAGNOSIS — Z87891 Personal history of nicotine dependence: Secondary | ICD-10-CM | POA: Diagnosis not present

## 2019-01-29 DIAGNOSIS — I700 Atherosclerosis of aorta: Secondary | ICD-10-CM | POA: Diagnosis not present

## 2019-01-29 DIAGNOSIS — I2584 Coronary atherosclerosis due to calcified coronary lesion: Secondary | ICD-10-CM | POA: Diagnosis not present

## 2019-01-29 DIAGNOSIS — I10 Essential (primary) hypertension: Secondary | ICD-10-CM | POA: Diagnosis not present

## 2019-01-29 DIAGNOSIS — I639 Cerebral infarction, unspecified: Secondary | ICD-10-CM | POA: Diagnosis not present

## 2019-01-29 DIAGNOSIS — E1129 Type 2 diabetes mellitus with other diabetic kidney complication: Secondary | ICD-10-CM | POA: Diagnosis not present

## 2019-03-28 DIAGNOSIS — E119 Type 2 diabetes mellitus without complications: Secondary | ICD-10-CM | POA: Diagnosis not present

## 2019-07-22 DIAGNOSIS — E785 Hyperlipidemia, unspecified: Secondary | ICD-10-CM | POA: Diagnosis not present

## 2019-07-22 DIAGNOSIS — R3129 Other microscopic hematuria: Secondary | ICD-10-CM | POA: Diagnosis not present

## 2019-07-22 DIAGNOSIS — R972 Elevated prostate specific antigen [PSA]: Secondary | ICD-10-CM | POA: Diagnosis not present

## 2019-07-22 DIAGNOSIS — I700 Atherosclerosis of aorta: Secondary | ICD-10-CM | POA: Diagnosis not present

## 2019-07-22 DIAGNOSIS — E1121 Type 2 diabetes mellitus with diabetic nephropathy: Secondary | ICD-10-CM | POA: Diagnosis not present

## 2019-07-29 DIAGNOSIS — E1169 Type 2 diabetes mellitus with other specified complication: Secondary | ICD-10-CM | POA: Diagnosis not present

## 2019-07-29 DIAGNOSIS — I2510 Atherosclerotic heart disease of native coronary artery without angina pectoris: Secondary | ICD-10-CM | POA: Diagnosis not present

## 2019-07-29 DIAGNOSIS — D649 Anemia, unspecified: Secondary | ICD-10-CM | POA: Diagnosis not present

## 2019-07-29 DIAGNOSIS — I700 Atherosclerosis of aorta: Secondary | ICD-10-CM | POA: Diagnosis not present

## 2019-07-29 DIAGNOSIS — I10 Essential (primary) hypertension: Secondary | ICD-10-CM | POA: Diagnosis not present

## 2019-07-29 DIAGNOSIS — B351 Tinea unguium: Secondary | ICD-10-CM | POA: Diagnosis not present

## 2019-07-29 DIAGNOSIS — E1129 Type 2 diabetes mellitus with other diabetic kidney complication: Secondary | ICD-10-CM | POA: Diagnosis not present

## 2019-07-29 DIAGNOSIS — Z0000 Encounter for general adult medical examination without abnormal findings: Secondary | ICD-10-CM | POA: Diagnosis not present

## 2019-07-29 DIAGNOSIS — R809 Proteinuria, unspecified: Secondary | ICD-10-CM | POA: Diagnosis not present

## 2019-08-30 ENCOUNTER — Telehealth: Payer: Self-pay

## 2019-08-30 DIAGNOSIS — Z87891 Personal history of nicotine dependence: Secondary | ICD-10-CM

## 2019-08-30 DIAGNOSIS — Z122 Encounter for screening for malignant neoplasm of respiratory organs: Secondary | ICD-10-CM

## 2019-08-30 NOTE — Telephone Encounter (Signed)
  Patient has been notified that the low dose lung cancer screening CT scan is due currently or will be in near future.  Confirmed that patient is within the appropriate age range and asymptomatic, (no signs or symptoms of lung cancer).  Patient denies illness that would prevent curative treatment for lung cancer if found.  Patient is agreeable for CT scan being scheduled.    Verified smoking history (former smoker, quit 12/2018 with 30 1 ppd year history).    CT scheduled for 7/1/1:30

## 2019-09-02 NOTE — Telephone Encounter (Signed)
Smoking history: former, quit 12/2018, 30 pack year

## 2019-09-02 NOTE — Addendum Note (Signed)
Addended by: Lieutenant Diego on: 09/02/2019 03:36 PM   Modules accepted: Orders

## 2019-09-12 ENCOUNTER — Ambulatory Visit
Admission: RE | Admit: 2019-09-12 | Discharge: 2019-09-12 | Disposition: A | Discharge status: Home / Self Care | Payer: Medicare HMO | Source: Ambulatory Visit | Attending: Oncology | Admitting: Oncology

## 2019-09-12 ENCOUNTER — Other Ambulatory Visit: Payer: Self-pay

## 2019-09-12 DIAGNOSIS — Z122 Encounter for screening for malignant neoplasm of respiratory organs: Secondary | ICD-10-CM | POA: Diagnosis not present

## 2019-09-12 DIAGNOSIS — Z87891 Personal history of nicotine dependence: Secondary | ICD-10-CM | POA: Diagnosis not present

## 2019-09-17 ENCOUNTER — Encounter: Payer: Self-pay | Admitting: *Deleted

## 2020-01-22 DIAGNOSIS — I10 Essential (primary) hypertension: Secondary | ICD-10-CM | POA: Diagnosis not present

## 2020-01-22 DIAGNOSIS — I2510 Atherosclerotic heart disease of native coronary artery without angina pectoris: Secondary | ICD-10-CM | POA: Diagnosis not present

## 2020-01-22 DIAGNOSIS — E1121 Type 2 diabetes mellitus with diabetic nephropathy: Secondary | ICD-10-CM | POA: Diagnosis not present

## 2020-01-22 DIAGNOSIS — E785 Hyperlipidemia, unspecified: Secondary | ICD-10-CM | POA: Diagnosis not present

## 2020-01-22 DIAGNOSIS — I700 Atherosclerosis of aorta: Secondary | ICD-10-CM | POA: Diagnosis not present

## 2020-01-22 DIAGNOSIS — R3129 Other microscopic hematuria: Secondary | ICD-10-CM | POA: Diagnosis not present

## 2020-01-27 DIAGNOSIS — I700 Atherosclerosis of aorta: Secondary | ICD-10-CM | POA: Diagnosis not present

## 2020-01-27 DIAGNOSIS — E785 Hyperlipidemia, unspecified: Secondary | ICD-10-CM | POA: Diagnosis not present

## 2020-01-27 DIAGNOSIS — E039 Hypothyroidism, unspecified: Secondary | ICD-10-CM | POA: Diagnosis not present

## 2020-01-27 DIAGNOSIS — Z23 Encounter for immunization: Secondary | ICD-10-CM | POA: Diagnosis not present

## 2020-01-27 DIAGNOSIS — B351 Tinea unguium: Secondary | ICD-10-CM | POA: Diagnosis not present

## 2020-01-27 DIAGNOSIS — E1121 Type 2 diabetes mellitus with diabetic nephropathy: Secondary | ICD-10-CM | POA: Diagnosis not present

## 2020-01-27 DIAGNOSIS — E1169 Type 2 diabetes mellitus with other specified complication: Secondary | ICD-10-CM | POA: Diagnosis not present

## 2020-01-27 DIAGNOSIS — I10 Essential (primary) hypertension: Secondary | ICD-10-CM | POA: Diagnosis not present

## 2020-01-27 DIAGNOSIS — I2510 Atherosclerotic heart disease of native coronary artery without angina pectoris: Secondary | ICD-10-CM | POA: Diagnosis not present

## 2020-02-14 DIAGNOSIS — E039 Hypothyroidism, unspecified: Secondary | ICD-10-CM | POA: Diagnosis not present

## 2020-04-02 DIAGNOSIS — E119 Type 2 diabetes mellitus without complications: Secondary | ICD-10-CM | POA: Diagnosis not present

## 2020-06-10 DIAGNOSIS — H2512 Age-related nuclear cataract, left eye: Secondary | ICD-10-CM | POA: Diagnosis not present

## 2020-06-15 ENCOUNTER — Other Ambulatory Visit: Payer: Self-pay

## 2020-06-15 ENCOUNTER — Encounter: Payer: Self-pay | Admitting: Ophthalmology

## 2020-06-18 ENCOUNTER — Other Ambulatory Visit
Admission: RE | Admit: 2020-06-18 | Discharge: 2020-06-18 | Disposition: A | Discharge status: Home / Self Care | Payer: Medicare HMO | Source: Ambulatory Visit | Attending: Ophthalmology | Admitting: Ophthalmology

## 2020-06-18 ENCOUNTER — Other Ambulatory Visit: Payer: Self-pay

## 2020-06-18 DIAGNOSIS — Z20822 Contact with and (suspected) exposure to covid-19: Secondary | ICD-10-CM | POA: Diagnosis not present

## 2020-06-18 DIAGNOSIS — Z01812 Encounter for preprocedural laboratory examination: Secondary | ICD-10-CM | POA: Diagnosis not present

## 2020-06-18 LAB — SARS CORONAVIRUS 2 (TAT 6-24 HRS): SARS Coronavirus 2: NEGATIVE

## 2020-06-18 NOTE — Discharge Instructions (Signed)

## 2020-06-22 ENCOUNTER — Other Ambulatory Visit: Payer: Self-pay

## 2020-06-22 ENCOUNTER — Encounter
Admission: RE | Disposition: A | Discharge status: Home / Self Care | Payer: Self-pay | Source: Home / Self Care | Attending: Ophthalmology

## 2020-06-22 ENCOUNTER — Ambulatory Visit: Payer: Medicare HMO | Admitting: Anesthesiology

## 2020-06-22 ENCOUNTER — Ambulatory Visit
Admission: RE | Admit: 2020-06-22 | Discharge: 2020-06-22 | Disposition: A | Discharge status: Home / Self Care | Payer: Medicare HMO | Attending: Ophthalmology | Admitting: Ophthalmology

## 2020-06-22 DIAGNOSIS — H25812 Combined forms of age-related cataract, left eye: Secondary | ICD-10-CM | POA: Diagnosis not present

## 2020-06-22 DIAGNOSIS — Z7982 Long term (current) use of aspirin: Secondary | ICD-10-CM | POA: Diagnosis not present

## 2020-06-22 DIAGNOSIS — E1136 Type 2 diabetes mellitus with diabetic cataract: Secondary | ICD-10-CM | POA: Diagnosis not present

## 2020-06-22 DIAGNOSIS — Z7984 Long term (current) use of oral hypoglycemic drugs: Secondary | ICD-10-CM | POA: Diagnosis not present

## 2020-06-22 DIAGNOSIS — Z79899 Other long term (current) drug therapy: Secondary | ICD-10-CM | POA: Diagnosis not present

## 2020-06-22 DIAGNOSIS — Z87891 Personal history of nicotine dependence: Secondary | ICD-10-CM | POA: Diagnosis not present

## 2020-06-22 DIAGNOSIS — H2512 Age-related nuclear cataract, left eye: Secondary | ICD-10-CM | POA: Insufficient documentation

## 2020-06-22 HISTORY — DX: Type 2 diabetes mellitus without complications: E11.9

## 2020-06-22 HISTORY — PX: CATARACT EXTRACTION W/PHACO: SHX586

## 2020-06-22 HISTORY — DX: Essential (primary) hypertension: I10

## 2020-06-22 HISTORY — DX: Presence of dental prosthetic device (complete) (partial): Z97.2

## 2020-06-22 HISTORY — DX: Cerebral infarction, unspecified: I63.9

## 2020-06-22 LAB — GLUCOSE, CAPILLARY
Glucose-Capillary: 120 mg/dL — ABNORMAL HIGH (ref 70–99)
Glucose-Capillary: 112 mg/dL — ABNORMAL HIGH (ref 70–99)

## 2020-06-22 SURGERY — PHACOEMULSIFICATION, CATARACT, WITH IOL INSERTION
Anesthesia: Monitor Anesthesia Care | Site: Eye | Laterality: Left

## 2020-06-22 MED ORDER — FENTANYL CITRATE (PF) 100 MCG/2ML IJ SOLN
INTRAMUSCULAR | Status: DC | PRN
  Administered 2020-06-22: 50 ug via INTRAVENOUS

## 2020-06-22 MED ORDER — ACETAMINOPHEN 325 MG PO TABS: 325.0000 mg | ORAL_TABLET | Freq: Once | ORAL | Status: DC

## 2020-06-22 MED ORDER — MOXIFLOXACIN HCL 0.5 % OP SOLN
OPHTHALMIC | Status: DC | PRN
  Administered 2020-06-22: 0.2 mL via OPHTHALMIC

## 2020-06-22 MED ORDER — TETRACAINE HCL 0.5 % OP SOLN
1.0000 [drp] | OPHTHALMIC | Status: DC | PRN
  Administered 2020-06-22 (×3): 1 [drp] via OPHTHALMIC

## 2020-06-22 MED ORDER — EPINEPHRINE PF 1 MG/ML IJ SOLN
INTRAOCULAR | Status: DC | PRN
  Administered 2020-06-22: 65 mL via OPHTHALMIC

## 2020-06-22 MED ORDER — LACTATED RINGERS IV SOLN: INTRAVENOUS | Status: DC

## 2020-06-22 MED ORDER — LIDOCAINE HCL (PF) 2 % IJ SOLN
INTRAOCULAR | Status: DC | PRN
  Administered 2020-06-22: 1 mL via INTRAOCULAR

## 2020-06-22 MED ORDER — SODIUM HYALURONATE 10 MG/ML IO SOLN
INTRAOCULAR | Status: DC | PRN
  Administered 2020-06-22: 0.55 mL via INTRAOCULAR

## 2020-06-22 MED ORDER — SODIUM HYALURONATE 23 MG/ML IO SOLN
INTRAOCULAR | Status: DC | PRN
  Administered 2020-06-22: 0.6 mL via INTRAOCULAR

## 2020-06-22 MED ORDER — ARMC OPHTHALMIC DILATING DROPS
1.0000 "application " | OPHTHALMIC | Status: DC | PRN
  Administered 2020-06-22 (×3): 1 via OPHTHALMIC

## 2020-06-22 MED ORDER — ACETAMINOPHEN 160 MG/5ML PO SOLN: 325.0000 mg | Freq: Once | ORAL | Status: DC

## 2020-06-22 MED ORDER — MIDAZOLAM HCL 2 MG/2ML IJ SOLN
INTRAMUSCULAR | Status: DC | PRN
  Administered 2020-06-22: 1 mg via INTRAVENOUS

## 2020-06-22 SURGICAL SUPPLY — 17 items
CANNULA ANT/CHMB 27GA (MISCELLANEOUS) ×4 IMPLANT
DISSECTOR HYDRO NUCLEUS 50X22 (MISCELLANEOUS) ×2 IMPLANT
GLOVE PI ULTRA LF STRL 7.5 (GLOVE) ×1 IMPLANT
GLOVE PI ULTRA NON LATEX 7.5 (GLOVE) ×1
GLOVE SURG SYN 8.5  E (GLOVE) ×1
GLOVE SURG SYN 8.5 E (GLOVE) ×1 IMPLANT
GOWN STRL REUS W/ TWL LRG LVL3 (GOWN DISPOSABLE) ×2 IMPLANT
GOWN STRL REUS W/TWL LRG LVL3 (GOWN DISPOSABLE) ×4
LENS IOL TECNIS EYHANCE 21.0 (Intraocular Lens) ×2 IMPLANT
MARKER SKIN DUAL TIP RULER LAB (MISCELLANEOUS) ×2 IMPLANT
PACK DR. KING ARMS (PACKS) ×2 IMPLANT
PACK EYE AFTER SURG (MISCELLANEOUS) ×2 IMPLANT
PACK OPTHALMIC (MISCELLANEOUS) ×2 IMPLANT
SYR 3ML LL SCALE MARK (SYRINGE) ×2 IMPLANT
SYR TB 1ML LUER SLIP (SYRINGE) ×2 IMPLANT
WATER STERILE IRR 250ML POUR (IV SOLUTION) ×2 IMPLANT
WIPE NON LINTING 3.25X3.25 (MISCELLANEOUS) ×2 IMPLANT

## 2020-06-22 NOTE — H&P (Signed)
[facility]   Primary Care Physician:  Adin Hector, MD Ophthalmologist: Dr. Benay Pillow  Pre-Procedure History & Physical: HPI:  Phillip Schriver. is a 70 y.o. male here for cataract surgery.   Past Medical History:  Diagnosis Date  . Hypertension   . Personal history of tobacco use, presenting hazards to health 07/17/2015  . Stroke ([place])    10-15 yrs ago. Occasional hand/foot numbness  . Tobacco abuse 07/23/2014  . Type 2 diabetes mellitus ([place])   . Wears dentures    partiel upper, full lower    Past Surgical History:  Procedure Laterality Date  . FRACTURE SURGERY     leg    Prior to Admission medications   Medication Sig Start Date End Date Taking? Authorizing Provider  amLODipine (NORVASC) 10 MG tablet Take 10 mg by mouth daily.   Yes [provider]  ASPIRIN 81 PO Take by mouth daily.   Yes [provider]  hydrochlorothiazide (HYDRODIURIL) 25 MG tablet Take 25 mg by mouth daily.   Yes [provider]  lisinopril (ZESTRIL) 40 MG tablet Take 40 mg by mouth daily.   Yes [provider]  metFORMIN (GLUCOPHAGE) 1000 MG tablet Take 1,000 mg by mouth 2 (two) times daily with a meal.   Yes [provider]  Omega-3 Fatty Acids (FISH OIL) 1000 MG CAPS Take by mouth in the morning and at bedtime.   Yes [provider]  pravastatin (PRAVACHOL) 40 MG tablet Take 40 mg by mouth daily.   Yes [provider]    Allergies as of 05/05/2020  . (Not on File)    History reviewed. No pertinent family history.  Social History   Socioeconomic History  . Marital status: Divorced    Spouse name: Not on file  . Number of children: Not on file  . Years of education: Not on file  . Highest education level: Not on file  Occupational History  . Not on file  Tobacco Use  . Smoking status: Former Smoker    Packs/day: 1.00    Years: 30.00    Pack years: 30.00    Types: Cigarettes    Quit date: 12/30/2018     Years since quitting: 1.4  . Smokeless tobacco: Never Used  Vaping Use  . Vaping Use: Never used  Substance and Sexual Activity  . Alcohol use: Not Currently    Alcohol/week: 0.0 standard drinks  . Drug use: Not on file  . Sexual activity: Not on file  Other Topics Concern  . Not on file  Social History Narrative  . Not on file   Social Determinants of Health   Financial Resource Strain: Not on file  Food Insecurity: Not on file  Transportation Needs: Not on file  Physical Activity: Not on file  Stress: Not on file  Social Connections: Not on file  Intimate Partner Violence: Not on file    Review of Systems: See HPI, otherwise negative ROS  Physical Exam: BP 105/72   Pulse (!) 59   Temp (!) 97 F (36.1 C) (Temporal)   Resp 18   Ht 5\' 8"  (1.727 m)   Wt 89.5 kg   SpO2 100%   BMI 30.00 kg/m  General:   Alert,  pleasant and cooperative in NAD Head:  Normocephalic and atraumatic. Respiratory:  Normal work of breathing. Cardiovascular:  RRR  Impression/Plan: Phillip Kent. is here for cataract surgery.  Risks, benefits, limitations, and alternatives regarding  cataract surgery have been reviewed with the patient.  Questions have been answered.  All parties agreeable.   Benay Pillow, MD  06/22/2020, 9:35 AM

## 2020-06-22 NOTE — Anesthesia Postprocedure Evaluation (Signed)
Anesthesia Post Note  Patient: Phillip Kent.  Procedure(s) Performed: CATARACT EXTRACTION PHACO AND INTRAOCULAR LENS PLACEMENT (IOC) LEFT DIABETIC 4.81 00:35.4 (Left Eye)     Patient location during evaluation: PACU Anesthesia Type: MAC Level of consciousness: awake and alert and oriented Pain management: satisfactory to patient Vital Signs Assessment: post-procedure vital signs reviewed and stable Respiratory status: spontaneous breathing, nonlabored ventilation and respiratory function stable Cardiovascular status: blood pressure returned to baseline and stable Postop Assessment: Adequate PO intake and No signs of nausea or vomiting Anesthetic complications: no   No complications documented.  Raliegh Ip

## 2020-06-22 NOTE — Op Note (Signed)
OPERATIVE NOTE  Phillip Kent [id number] 06/22/2020   PREOPERATIVE DIAGNOSIS:  Nuclear sclerotic cataract left eye.  H25.12   POSTOPERATIVE DIAGNOSIS:    Nuclear sclerotic cataract left eye.     PROCEDURE:  Phacoemusification with posterior chamber intraocular lens placement of the left eye   LENS:   Implant Name Type Inv. Item Serial No. Manufacturer Lot No. LRB No. Used Action  LENS IOL TECNIS EYHANCE 21.0 - [id number] Intraocular Lens LENS IOL TECNIS EYHANCE 21.0 [id number] JOHNSON   Left 1 Implanted      Procedure(s) with comments: CATARACT EXTRACTION PHACO AND INTRAOCULAR LENS PLACEMENT (IOC) LEFT DIABETIC 4.81 00:35.4 (Left) - Diabetic - oral meds  DIB00 +21.0   ULTRASOUND TIME: 0 minutes 35 seconds.  CDE 4.81   SURGEON:  Benay Pillow, MD, MPH   ANESTHESIA:  Topical with tetracaine drops augmented with 1% preservative-free intracameral lidocaine.  ESTIMATED BLOOD LOSS: <1 mL   COMPLICATIONS:  None.   DESCRIPTION OF PROCEDURE:  The patient was identified in the holding room and transported to the operating room and placed in the supine position under the operating microscope.  The left eye was identified as the operative eye and it was prepped and draped in the usual sterile ophthalmic fashion.   A 1.0 millimeter clear-corneal paracentesis was made at the 5:00 position. 0.5 ml of preservative-free 1% lidocaine with epinephrine was injected into the anterior chamber.  The anterior chamber was filled with Healon 5 viscoelastic.  A 2.4 millimeter keratome was used to make a near-clear corneal incision at the 2:00 position.  A curvilinear capsulorrhexis was made with a cystotome and capsulorrhexis forceps.  Balanced salt solution was used to hydrodissect and hydrodelineate the nucleus.   Phacoemulsification was then used in stop and chop fashion to remove the lens nucleus and epinucleus.  The remaining cortex was then removed using the irrigation and aspiration  handpiece. Healon was then placed into the capsular bag to distend it for lens placement.  A lens was then injected into the capsular bag.  The remaining viscoelastic was aspirated.   Wounds were hydrated with balanced salt solution.  The anterior chamber was inflated to a physiologic pressure with balanced salt solution.  Intracameral vigamox 0.1 mL undiltued was injected into the eye and a drop placed onto the ocular surface.  No wound leaks were noted.  The patient was taken to the recovery room in stable condition without complications of anesthesia or surgery  Benay Pillow 06/22/2020, 10:05 AM

## 2020-06-22 NOTE — Anesthesia Procedure Notes (Signed)
Procedure Name: MAC Date/Time: 06/22/2020 9:50 AM Performed by: Cameron Ali, CRNA Pre-anesthesia Checklist: Patient identified, Emergency Drugs available, Suction available, Patient being monitored and Timeout performed Patient Re-evaluated:Patient Re-evaluated prior to induction Oxygen Delivery Method: Simple face mask Placement Confirmation: positive ETCO2 and breath sounds checked- equal and bilateral

## 2020-06-22 NOTE — Transfer of Care (Signed)
Immediate Anesthesia Transfer of Care Note  Patient: Phillip Kent.  Procedure(s) Performed: CATARACT EXTRACTION PHACO AND INTRAOCULAR LENS PLACEMENT (IOC) LEFT DIABETIC 4.81 00:35.4 (Left Eye)  Patient Location: PACU  Anesthesia Type: MAC  Level of Consciousness: awake, alert  and patient cooperative  Airway and Oxygen Therapy: Patient Spontanous Breathing and Patient connected to supplemental oxygen  Post-op Assessment: Post-op Vital signs reviewed, Patient's Cardiovascular Status Stable, Respiratory Function Stable, Patent Airway and No signs of Nausea or vomiting  Post-op Vital Signs: Reviewed and stable  Complications: No complications documented.

## 2020-06-22 NOTE — Anesthesia Preprocedure Evaluation (Signed)
Anesthesia Evaluation  Patient identified by MRN, date of birth, ID band Patient awake    Reviewed: Allergy & Precautions, H&P , NPO status , Patient's Chart, lab work & pertinent test results  Airway Mallampati: II  TM Distance: >3 FB Neck ROM: full    Dental no notable dental hx.    Pulmonary former smoker,    Pulmonary exam normal breath sounds clear to auscultation       Cardiovascular hypertension, Normal cardiovascular exam Rhythm:regular Rate:Normal     Neuro/Psych    GI/Hepatic GERD  ,  Endo/Other  diabetes, Oral Hypoglycemic Agents  Renal/GU      Musculoskeletal   Abdominal   Peds  Hematology   Anesthesia Other Findings   Reproductive/Obstetrics                             Anesthesia Physical Anesthesia Plan  ASA: II  Anesthesia Plan: MAC   Post-op Pain Management:    Induction:   PONV Risk Score and Plan: 1 and Treatment may vary due to age or medical condition, Midazolam and TIVA  Airway Management Planned:   Additional Equipment:   Intra-op Plan:   Post-operative Plan:   Informed Consent: I have reviewed the patients History and Physical, chart, labs and discussed the procedure including the risks, benefits and alternatives for the proposed anesthesia with the patient or authorized representative who has indicated his/her understanding and acceptance.     Dental Advisory Given  Plan Discussed with: CRNA  Anesthesia Plan Comments:         Anesthesia Quick Evaluation

## 2020-06-23 ENCOUNTER — Encounter: Payer: Self-pay | Admitting: Ophthalmology

## 2020-07-22 DIAGNOSIS — R3129 Other microscopic hematuria: Secondary | ICD-10-CM | POA: Diagnosis not present

## 2020-07-22 DIAGNOSIS — I700 Atherosclerosis of aorta: Secondary | ICD-10-CM | POA: Diagnosis not present

## 2020-07-22 DIAGNOSIS — Z125 Encounter for screening for malignant neoplasm of prostate: Secondary | ICD-10-CM | POA: Diagnosis not present

## 2020-07-22 DIAGNOSIS — E1121 Type 2 diabetes mellitus with diabetic nephropathy: Secondary | ICD-10-CM | POA: Diagnosis not present

## 2020-07-22 DIAGNOSIS — E785 Hyperlipidemia, unspecified: Secondary | ICD-10-CM | POA: Diagnosis not present

## 2020-07-22 DIAGNOSIS — I2510 Atherosclerotic heart disease of native coronary artery without angina pectoris: Secondary | ICD-10-CM | POA: Diagnosis not present

## 2020-07-22 DIAGNOSIS — I10 Essential (primary) hypertension: Secondary | ICD-10-CM | POA: Diagnosis not present

## 2020-07-23 ENCOUNTER — Encounter: Payer: Self-pay | Admitting: Ophthalmology

## 2020-07-29 DIAGNOSIS — E785 Hyperlipidemia, unspecified: Secondary | ICD-10-CM | POA: Diagnosis not present

## 2020-07-29 DIAGNOSIS — E1121 Type 2 diabetes mellitus with diabetic nephropathy: Secondary | ICD-10-CM | POA: Diagnosis not present

## 2020-07-29 DIAGNOSIS — Z0000 Encounter for general adult medical examination without abnormal findings: Secondary | ICD-10-CM | POA: Diagnosis not present

## 2020-07-29 DIAGNOSIS — B351 Tinea unguium: Secondary | ICD-10-CM | POA: Diagnosis not present

## 2020-07-29 DIAGNOSIS — I10 Essential (primary) hypertension: Secondary | ICD-10-CM | POA: Diagnosis not present

## 2020-07-29 DIAGNOSIS — D649 Anemia, unspecified: Secondary | ICD-10-CM | POA: Diagnosis not present

## 2020-07-29 DIAGNOSIS — I700 Atherosclerosis of aorta: Secondary | ICD-10-CM | POA: Diagnosis not present

## 2020-07-29 DIAGNOSIS — I2510 Atherosclerotic heart disease of native coronary artery without angina pectoris: Secondary | ICD-10-CM | POA: Diagnosis not present

## 2020-07-29 DIAGNOSIS — E1169 Type 2 diabetes mellitus with other specified complication: Secondary | ICD-10-CM | POA: Diagnosis not present

## 2020-07-30 DIAGNOSIS — H2511 Age-related nuclear cataract, right eye: Secondary | ICD-10-CM | POA: Diagnosis not present

## 2020-07-30 NOTE — Discharge Instructions (Signed)

## 2020-08-03 ENCOUNTER — Encounter: Payer: Self-pay | Admitting: Ophthalmology

## 2020-08-03 ENCOUNTER — Encounter
Admission: RE | Disposition: A | Discharge status: Home / Self Care | Payer: Self-pay | Source: Home / Self Care | Attending: Ophthalmology

## 2020-08-03 ENCOUNTER — Ambulatory Visit: Payer: Medicare HMO | Admitting: Anesthesiology

## 2020-08-03 ENCOUNTER — Ambulatory Visit
Admission: RE | Admit: 2020-08-03 | Discharge: 2020-08-03 | Disposition: A | Discharge status: Home / Self Care | Payer: Medicare HMO | Attending: Ophthalmology | Admitting: Ophthalmology

## 2020-08-03 DIAGNOSIS — Z79899 Other long term (current) drug therapy: Secondary | ICD-10-CM | POA: Diagnosis not present

## 2020-08-03 DIAGNOSIS — K219 Gastro-esophageal reflux disease without esophagitis: Secondary | ICD-10-CM | POA: Diagnosis not present

## 2020-08-03 DIAGNOSIS — H25811 Combined forms of age-related cataract, right eye: Secondary | ICD-10-CM | POA: Diagnosis not present

## 2020-08-03 DIAGNOSIS — I10 Essential (primary) hypertension: Secondary | ICD-10-CM | POA: Diagnosis not present

## 2020-08-03 DIAGNOSIS — E1136 Type 2 diabetes mellitus with diabetic cataract: Secondary | ICD-10-CM | POA: Diagnosis not present

## 2020-08-03 DIAGNOSIS — Z87891 Personal history of nicotine dependence: Secondary | ICD-10-CM | POA: Insufficient documentation

## 2020-08-03 DIAGNOSIS — H2511 Age-related nuclear cataract, right eye: Secondary | ICD-10-CM | POA: Insufficient documentation

## 2020-08-03 HISTORY — PX: CATARACT EXTRACTION W/PHACO: SHX586

## 2020-08-03 LAB — GLUCOSE, CAPILLARY
Glucose-Capillary: 99 mg/dL (ref 70–99)
Glucose-Capillary: 117 mg/dL — ABNORMAL HIGH (ref 70–99)
Glucose-Capillary: 149 mg/dL — ABNORMAL HIGH (ref 70–99)

## 2020-08-03 SURGERY — PHACOEMULSIFICATION, CATARACT, WITH IOL INSERTION
Anesthesia: Monitor Anesthesia Care | Site: Eye | Laterality: Right

## 2020-08-03 MED ORDER — SODIUM HYALURONATE 10 MG/ML IO SOLUTION
PREFILLED_SYRINGE | INTRAOCULAR | Status: DC | PRN
  Administered 2020-08-03: 0.55 mL via INTRAOCULAR

## 2020-08-03 MED ORDER — MOXIFLOXACIN HCL 0.5 % OP SOLN
OPHTHALMIC | Status: DC | PRN
  Administered 2020-08-03: 0.2 mL via OPHTHALMIC

## 2020-08-03 MED ORDER — TETRACAINE HCL 0.5 % OP SOLN
1.0000 [drp] | OPHTHALMIC | Status: DC | PRN
  Administered 2020-08-03 (×3): 1 [drp] via OPHTHALMIC

## 2020-08-03 MED ORDER — FENTANYL CITRATE (PF) 100 MCG/2ML IJ SOLN
INTRAMUSCULAR | Status: DC | PRN
  Administered 2020-08-03: 50 ug via INTRAVENOUS

## 2020-08-03 MED ORDER — SODIUM HYALURONATE 23MG/ML IO SOSY
PREFILLED_SYRINGE | INTRAOCULAR | Status: DC | PRN
  Administered 2020-08-03: 0.6 mL via INTRAOCULAR

## 2020-08-03 MED ORDER — EPINEPHRINE PF 1 MG/ML IJ SOLN
INTRAOCULAR | Status: DC | PRN
  Administered 2020-08-03: 69 mL via OPHTHALMIC

## 2020-08-03 MED ORDER — ONDANSETRON HCL 4 MG/2ML IJ SOLN: 4.0000 mg | Freq: Once | INTRAMUSCULAR | Status: DC | PRN

## 2020-08-03 MED ORDER — ACETAMINOPHEN 160 MG/5ML PO SOLN: 325.0000 mg | ORAL | Status: DC | PRN

## 2020-08-03 MED ORDER — LIDOCAINE HCL (PF) 2 % IJ SOLN
INTRAOCULAR | Status: DC | PRN
  Administered 2020-08-03: 1 mL via INTRAOCULAR

## 2020-08-03 MED ORDER — ACETAMINOPHEN 325 MG PO TABS
325.0000 mg | ORAL_TABLET | ORAL | Status: DC | PRN
Start: 2020-08-03 — End: 2020-08-03

## 2020-08-03 MED ORDER — ARMC OPHTHALMIC DILATING DROPS
1.0000 "application " | OPHTHALMIC | Status: DC | PRN
  Administered 2020-08-03 (×3): 1 via OPHTHALMIC

## 2020-08-03 MED ORDER — MIDAZOLAM HCL 2 MG/2ML IJ SOLN
INTRAMUSCULAR | Status: DC | PRN
  Administered 2020-08-03: 1 mg via INTRAVENOUS

## 2020-08-03 SURGICAL SUPPLY — 17 items
CANNULA ANT/CHMB 27GA (MISCELLANEOUS) ×4 IMPLANT
DISSECTOR HYDRO NUCLEUS 50X22 (MISCELLANEOUS) ×2 IMPLANT
GLOVE PI ULTRA LF STRL 7.5 (GLOVE) ×1 IMPLANT
GLOVE PI ULTRA NON LATEX 7.5 (GLOVE) ×1
GLOVE SURG SYN 8.5  E (GLOVE) ×1
GLOVE SURG SYN 8.5 E (GLOVE) ×1 IMPLANT
GOWN STRL REUS W/ TWL LRG LVL3 (GOWN DISPOSABLE) ×2 IMPLANT
GOWN STRL REUS W/TWL LRG LVL3 (GOWN DISPOSABLE) ×4
LENS IOL TECNIS EYHANCE 21.5 (Intraocular Lens) ×2 IMPLANT
MARKER SKIN DUAL TIP RULER LAB (MISCELLANEOUS) ×2 IMPLANT
PACK DR. KING ARMS (PACKS) ×2 IMPLANT
PACK EYE AFTER SURG (MISCELLANEOUS) ×2 IMPLANT
PACK OPTHALMIC (MISCELLANEOUS) ×2 IMPLANT
SYR 3ML LL SCALE MARK (SYRINGE) ×2 IMPLANT
SYR TB 1ML LUER SLIP (SYRINGE) ×2 IMPLANT
WATER STERILE IRR 250ML POUR (IV SOLUTION) ×2 IMPLANT
WIPE NON LINTING 3.25X3.25 (MISCELLANEOUS) ×2 IMPLANT

## 2020-08-03 NOTE — Op Note (Signed)
OPERATIVE NOTE  Phillip Kent [id number] 08/03/2020   PREOPERATIVE DIAGNOSIS:  Nuclear sclerotic cataract right eye.  H25.11   POSTOPERATIVE DIAGNOSIS:    Nuclear sclerotic cataract right eye.     PROCEDURE:  Phacoemusification with posterior chamber intraocular lens placement of the right eye   LENS:   Implant Name Type Inv. Item Serial No. Manufacturer Lot No. LRB No. Used Action  LENS IOL TECNIS EYHANCE 21.5 - [id number] Intraocular Lens LENS IOL TECNIS EYHANCE 21.5 [id number] JOHNSON   Right 1 Implanted       Procedure(s): CATARACT EXTRACTION PHACO AND INTRAOCULAR LENS PLACEMENT (IOC) RIGHT DIABETIC 3.61 00:28.8 (Right)  DIB00 +21.5  SURGEON:  Benay Pillow, MD, MPH  ANESTHESIOLOGIST: Anesthesiologist: Veda Canning, MD CRNA: Cameron Ali, CRNA   ANESTHESIA:  Topical with tetracaine drops augmented with 1% preservative-free intracameral lidocaine.  ESTIMATED BLOOD LOSS: less than 1 mL.   COMPLICATIONS:  None.   DESCRIPTION OF PROCEDURE:  The patient was identified in the holding room and transported to the operating room and placed in the supine position under the operating microscope.  The right eye was identified as the operative eye and it was prepped and draped in the usual sterile ophthalmic fashion.   A 1.0 millimeter clear-corneal paracentesis was made at the 10:30 position. 0.5 ml of preservative-free 1% lidocaine with epinephrine was injected into the anterior chamber.  The anterior chamber was filled with Healon 5 viscoelastic.  A 2.4 millimeter keratome was used to make a near-clear corneal incision at the 8:00 position.  A curvilinear capsulorrhexis was made with a cystotome and capsulorrhexis forceps.  Balanced salt solution was used to hydrodissect and hydrodelineate the nucleus.   Phacoemulsification was then used in stop and chop fashion to remove the lens nucleus and epinucleus.  The remaining cortex was then removed using the irrigation and  aspiration handpiece. Healon was then placed into the capsular bag to distend it for lens placement.  A lens was then injected into the capsular bag.  The remaining viscoelastic was aspirated.   Wounds were hydrated with balanced salt solution.  The anterior chamber was inflated to a physiologic pressure with balanced salt solution.   Intracameral vigamox 0.1 mL undiluted was injected into the eye and a drop placed onto the ocular surface.  No wound leaks were noted.  The patient was taken to the recovery room in stable condition without complications of anesthesia or surgery  Benay Pillow 08/03/2020, 8:28 AM

## 2020-08-03 NOTE — Anesthesia Preprocedure Evaluation (Signed)
Anesthesia Evaluation  Patient identified by MRN, date of birth, ID band Patient awake    Reviewed: Allergy & Precautions, H&P , NPO status   Airway Mallampati: II  TM Distance: >3 FB Neck ROM: full    Dental no notable dental hx.    Pulmonary former smoker,    breath sounds clear to auscultation       Cardiovascular hypertension,  Rhythm:regular Rate:Normal     Neuro/Psych CVA    GI/Hepatic GERD  ,  Endo/Other  diabetes, Oral Hypoglycemic Agents  Renal/GU      Musculoskeletal   Abdominal   Peds  Hematology   Anesthesia Other Findings   Reproductive/Obstetrics                             Anesthesia Physical  Anesthesia Plan  ASA: II  Anesthesia Plan: MAC   Post-op Pain Management:    Induction:   PONV Risk Score and Plan: 1 and Treatment may vary due to age or medical condition, Midazolam and TIVA  Airway Management Planned:   Additional Equipment:   Intra-op Plan:   Post-operative Plan:   Informed Consent: I have reviewed the patients History and Physical, chart, labs and discussed the procedure including the risks, benefits and alternatives for the proposed anesthesia with the patient or authorized representative who has indicated his/her understanding and acceptance.     Dental Advisory Given  Plan Discussed with: CRNA  Anesthesia Plan Comments:         Anesthesia Quick Evaluation

## 2020-08-03 NOTE — Transfer of Care (Signed)
Immediate Anesthesia Transfer of Care Note  Patient: Phillip Kent.  Procedure(s) Performed: CATARACT EXTRACTION PHACO AND INTRAOCULAR LENS PLACEMENT (IOC) RIGHT DIABETIC 3.61 00:28.8 (Right Eye)  Patient Location: PACU  Anesthesia Type: MAC  Level of Consciousness: awake, alert  and patient cooperative  Airway and Oxygen Therapy: Patient Spontanous Breathing and Patient connected to supplemental oxygen  Post-op Assessment: Post-op Vital signs reviewed, Patient's Cardiovascular Status Stable, Respiratory Function Stable, Patent Airway and No signs of Nausea or vomiting  Post-op Vital Signs: Reviewed and stable  Complications: No complications documented.

## 2020-08-03 NOTE — Anesthesia Procedure Notes (Signed)
Procedure Name: MAC Date/Time: 08/03/2020 8:16 AM Performed by: Cameron Ali, CRNA Pre-anesthesia Checklist: Patient identified, Emergency Drugs available, Suction available, Timeout performed and Patient being monitored Patient Re-evaluated:Patient Re-evaluated prior to induction Oxygen Delivery Method: Nasal cannula Placement Confirmation: positive ETCO2

## 2020-08-03 NOTE — H&P (Signed)
[facility]   Primary Care Physician:  Adin Hector, MD Ophthalmologist: Dr. Benay Pillow  Pre-Procedure History & Physical: HPI:  Phillip Kent. is a 70 y.o. male here for cataract surgery.   Past Medical History:  Diagnosis Date  . Hypertension   . Personal history of tobacco use, presenting hazards to health 07/17/2015  . Stroke ([place])    10-15 yrs ago. Occasional hand/foot numbness  . Tobacco abuse 07/23/2014  . Type 2 diabetes mellitus ([place])   . Wears dentures    partiel upper, full lower    Past Surgical History:  Procedure Laterality Date  . CATARACT EXTRACTION W/PHACO Left 06/22/2020   Procedure: CATARACT EXTRACTION PHACO AND INTRAOCULAR LENS PLACEMENT (IOC) LEFT DIABETIC 4.81 00:35.4;  Surgeon: Eulogio Bear, MD;  Location: [place];  Service: Ophthalmology;  Laterality: Left;  Diabetic - oral meds  . FRACTURE SURGERY     leg    Prior to Admission medications   Medication Sig Start Date End Date Taking? Authorizing Provider  amLODipine (NORVASC) 10 MG tablet Take 10 mg by mouth daily.   Yes [provider]  ASPIRIN 81 PO Take by mouth daily.   Yes [provider]  hydrochlorothiazide (HYDRODIURIL) 25 MG tablet Take 25 mg by mouth daily.   Yes [provider]  lisinopril (ZESTRIL) 40 MG tablet Take 40 mg by mouth daily.   Yes [provider]  metFORMIN (GLUCOPHAGE) 1000 MG tablet Take 1,000 mg by mouth 2 (two) times daily with a meal.   Yes [provider]  Omega-3 Fatty Acids (FISH OIL) 1000 MG CAPS Take by mouth in the morning and at bedtime.   Yes [provider]  pravastatin (PRAVACHOL) 40 MG tablet Take 40 mg by mouth daily.   Yes [provider]    Allergies as of 07/03/2020  . (No Known Allergies)    History reviewed. No pertinent family history.  Social History   Socioeconomic History  . Marital status: Divorced    Spouse name: Not on file  . Number of  children: Not on file  . Years of education: Not on file  . Highest education level: Not on file  Occupational History  . Not on file  Tobacco Use  . Smoking status: Former Smoker    Packs/day: 1.00    Years: 30.00    Pack years: 30.00    Types: Cigarettes    Quit date: 12/30/2018    Years since quitting: 1.5  . Smokeless tobacco: Never Used  Vaping Use  . Vaping Use: Never used  Substance and Sexual Activity  . Alcohol use: Not Currently    Alcohol/week: 0.0 standard drinks  . Drug use: Not on file  . Sexual activity: Not on file  Other Topics Concern  . Not on file  Social History Narrative  . Not on file   Social Determinants of Health   Financial Resource Strain: Not on file  Food Insecurity: Not on file  Transportation Needs: Not on file  Physical Activity: Not on file  Stress: Not on file  Social Connections: Not on file  Intimate Partner Violence: Not on file    Review of Systems: See HPI, otherwise negative ROS  Physical Exam: BP 121/62   Pulse (!) 58   Temp (!) 97.2 F (36.2 C) (Temporal)   Resp 20   Ht 5\' 8"  (1.727 m)   Wt 88 kg   SpO2 99%   BMI 29.50  kg/m  General:   Alert,  pleasant and cooperative in NAD Head:  Normocephalic and atraumatic. Respiratory:  Normal work of breathing. Cardiovascular:  RRR  Impression/Plan: Phillip Kent. is here for cataract surgery.  Risks, benefits, limitations, and alternatives regarding cataract surgery have been reviewed with the patient.  Questions have been answered.  All parties agreeable.   Benay Pillow, MD  08/03/2020, 8:00 AM

## 2020-08-03 NOTE — Anesthesia Postprocedure Evaluation (Signed)
Anesthesia Post Note  Patient: Phillip Kent.  Procedure(s) Performed: CATARACT EXTRACTION PHACO AND INTRAOCULAR LENS PLACEMENT (IOC) RIGHT DIABETIC 3.61 00:28.8 (Right Eye)     Patient location during evaluation: PACU Anesthesia Type: MAC Level of consciousness: awake Pain management: pain level controlled Vital Signs Assessment: post-procedure vital signs reviewed and stable Respiratory status: respiratory function stable Cardiovascular status: stable Postop Assessment: no apparent nausea or vomiting Anesthetic complications: no   No complications documented.  Veda Canning

## 2020-08-04 ENCOUNTER — Encounter: Payer: Self-pay | Admitting: Ophthalmology

## 2020-08-21 DIAGNOSIS — L570 Actinic keratosis: Secondary | ICD-10-CM | POA: Diagnosis not present

## 2020-08-21 DIAGNOSIS — L578 Other skin changes due to chronic exposure to nonionizing radiation: Secondary | ICD-10-CM | POA: Diagnosis not present

## 2020-08-21 DIAGNOSIS — D225 Melanocytic nevi of trunk: Secondary | ICD-10-CM | POA: Diagnosis not present

## 2020-08-21 DIAGNOSIS — L821 Other seborrheic keratosis: Secondary | ICD-10-CM | POA: Diagnosis not present

## 2020-08-28 DIAGNOSIS — E039 Hypothyroidism, unspecified: Secondary | ICD-10-CM | POA: Diagnosis not present

## 2020-08-28 DIAGNOSIS — D649 Anemia, unspecified: Secondary | ICD-10-CM | POA: Diagnosis not present

## 2020-09-02 DIAGNOSIS — D649 Anemia, unspecified: Secondary | ICD-10-CM | POA: Diagnosis not present

## 2020-09-21 ENCOUNTER — Telehealth: Payer: Self-pay | Admitting: *Deleted

## 2020-09-21 DIAGNOSIS — Z87891 Personal history of nicotine dependence: Secondary | ICD-10-CM

## 2020-09-21 DIAGNOSIS — Z122 Encounter for screening for malignant neoplasm of respiratory organs: Secondary | ICD-10-CM

## 2020-09-21 NOTE — Telephone Encounter (Signed)
Patient returned my call re: scheduling the annual low dose lung screening ct scan. Former smoker, quit in 2020, 30 pack yrs. Scan scheduled for 09/30/20 @ 10:30 am.

## 2020-09-21 NOTE — Telephone Encounter (Signed)
Left message for patient to notify him that it is time to schedule annual f/u low dose lung cancer screening CT scan. Instructed patient to call back [phone]) to verify information and schedule.

## 2020-09-30 ENCOUNTER — Ambulatory Visit
Admission: RE | Admit: 2020-09-30 | Discharge: 2020-09-30 | Disposition: A | Discharge status: Home / Self Care | Payer: Medicare HMO | Source: Ambulatory Visit | Attending: Acute Care | Admitting: Acute Care

## 2020-09-30 ENCOUNTER — Other Ambulatory Visit: Payer: Self-pay

## 2020-09-30 DIAGNOSIS — Z122 Encounter for screening for malignant neoplasm of respiratory organs: Secondary | ICD-10-CM

## 2020-09-30 DIAGNOSIS — Z87891 Personal history of nicotine dependence: Secondary | ICD-10-CM | POA: Insufficient documentation

## 2020-10-02 ENCOUNTER — Telehealth: Payer: Self-pay | Admitting: *Deleted

## 2020-10-02 NOTE — Telephone Encounter (Signed)
Notified patient of LDCT lung cancer screening program results with recommendation for 12 month follow up imaging. Also notified of incidental findings noted below and is encouraged to discuss further with PCP who will receive a copy of this note and/or the CT report. Patient verbalizes understanding.  Specifically, stressed the importance of further evaluation of renal artery finding. Patient verbalizes understanding and will contact his PCP if he is not contacted by them soon.   IMPRESSION: 1. Lung-RADS 2S, benign appearance or behavior. Continue annual screening with low-dose chest CT without contrast in 12 months. 2. The "S" modifier above refers to potentially clinically significant non lung cancer related findings. Specifically, there is a structure in the left retroperitoneum which is highly suspicious for a renal artery aneurysm measuring 2.0 x 1.6 cm. Further evaluation with CTA of the abdomen is suggested in the near future to better evaluate this finding. 3. Aortic atherosclerosis, in addition to left main and 3 vessel coronary artery disease. Assessment for potential risk factor modification, dietary therapy or pharmacologic therapy may be warranted, if clinically indicated. 4. Mild diffuse bronchial wall thickening with very mild centrilobular and paraseptal emphysema; imaging findings suggestive of underlying COPD.   Aortic Atherosclerosis (ICD10-I70.0) and Emphysema (ICD10-J43.9).

## 2020-10-02 NOTE — Progress Notes (Signed)
I have called the patient with the results of his low dose CT. I explained that there was notation of  a structure in the left retroperitoneum which is highly suspicious for a renal artery aneurysm measuring 2.0 x 1.6 cm. Further evaluation with CTA of the abdomen is suggested in the near future to better evaluate this finding. He had already been notified of this finding by Burgess Estelle who asked the patient to call his PCP for follow up imaging.   Dr. Lynelle Doctor, I have included you on this results so that you can follow up . Thanks so much. Please  let me know if you have any questions or concerns.   Langley Gauss, place order for follow up LDCT  for 09/2021.  and fax results to Dr. Caryl Comes. Thanks so much

## 2020-10-05 ENCOUNTER — Other Ambulatory Visit: Payer: Self-pay | Admitting: *Deleted

## 2020-10-05 DIAGNOSIS — Z87891 Personal history of nicotine dependence: Secondary | ICD-10-CM

## 2020-10-06 DIAGNOSIS — I722 Aneurysm of renal artery: Secondary | ICD-10-CM | POA: Insufficient documentation

## 2020-10-20 ENCOUNTER — Ambulatory Visit (INDEPENDENT_AMBULATORY_CARE_PROVIDER_SITE_OTHER): Payer: Medicare HMO | Admitting: Vascular Surgery

## 2020-10-20 ENCOUNTER — Other Ambulatory Visit: Payer: Self-pay

## 2020-10-20 ENCOUNTER — Encounter (INDEPENDENT_AMBULATORY_CARE_PROVIDER_SITE_OTHER): Payer: Self-pay | Admitting: Vascular Surgery

## 2020-10-20 VITALS — BP 118/66 | HR 66 | Resp 16 | Ht 68.0 in | Wt 195.2 lb

## 2020-10-20 DIAGNOSIS — E1121 Type 2 diabetes mellitus with diabetic nephropathy: Secondary | ICD-10-CM | POA: Diagnosis not present

## 2020-10-20 DIAGNOSIS — I722 Aneurysm of renal artery: Secondary | ICD-10-CM | POA: Diagnosis not present

## 2020-10-20 DIAGNOSIS — R42 Dizziness and giddiness: Secondary | ICD-10-CM | POA: Insufficient documentation

## 2020-10-20 DIAGNOSIS — R3129 Other microscopic hematuria: Secondary | ICD-10-CM | POA: Insufficient documentation

## 2020-10-20 DIAGNOSIS — I10 Essential (primary) hypertension: Secondary | ICD-10-CM | POA: Diagnosis not present

## 2020-10-20 DIAGNOSIS — L409 Psoriasis, unspecified: Secondary | ICD-10-CM | POA: Insufficient documentation

## 2020-10-20 NOTE — Assessment & Plan Note (Signed)
I have independently reviewed the CT scan of the chest from last month.  This demonstrates a left renal artery aneurysm approaching 2 cm in diameter and what appeared to be the left main renal artery or at the primary bifurcation.  This is an uninfused chest CT so the imaging is extremely limited.  In looking back, this had a similar appearance back in 2020.  It was not well seen on the imaging from 2021, but it is likely that the scan was a few inches higher and just missed the aneurysm.  Again, this was a chest CT. Given these findings, I had a long discussion today regarding treatment of renal artery aneurysms with the patient.  In a patient over 70 without significant hypertension or renal dysfunction, immediate repair would not be performed unless this were in excess of 2.5 cm.  Should he have renovascular hypertension which is severe or other symptoms, repair could be considered.  At this point, I would recommend a 44-monthfollow-up with a CT angiogram of the abdomen and pelvis for further evaluation.  This will give [place] much better view of the aneurysm.  The fact that this was seen on scans a couple of years ago is also encouraging that has not had dramatic growth.  The patient voices understanding and is agreeable with our plan of care.

## 2020-10-20 NOTE — Assessment & Plan Note (Signed)
blood pressure control important in reducing the progression of atherosclerotic disease and aneurysmal growth. On appropriate oral medications.  

## 2020-10-20 NOTE — Progress Notes (Signed)
Patient ID: Phillip Frerichs., male   DOB: [birth year], 70 y.o.   MRN: [id number]  Chief Complaint  Patient presents with   New Patient (Initial Visit)    Ref Caryl Comes vascular consult (CT chest) in epic   HPI Phillip Kent. is a 70 y.o. male.  I am asked to see the patient by Dr. Caryl Comes for evaluation of renal artery aneurysm that was incidentally found on a recent CT scan of the chest for lung cancer screening.  I have independently reviewed the CT scan of the chest from last month.  This demonstrates a left renal artery aneurysm approaching 2 cm in diameter and what appeared to be the left main renal artery or at the primary bifurcation.  This is an uninfused chest CT so the imaging is extremely limited.  In looking back, this had a similar appearance back in 2020.  It was not well seen on the imaging from 2021, but it is likely that the scan was a few inches higher and just missed the aneurysm.  Again, this was a chest CT.  The patient reports intermittent abdominal and back pain but nothing out of the ordinary.  He actually says his blood pressure is very well controlled and usually runs in the 99991111 20 systolic range.  No renal dysfunction to his knowledge.   Past Medical History:  Diagnosis Date   Hypertension    Personal history of tobacco use, presenting hazards to health 07/17/2015   Stroke ([place])    10-15 yrs ago. Occasional hand/foot numbness   Tobacco abuse 07/23/2014   Type 2 diabetes mellitus ([place])    Wears dentures    partiel upper, full lower    Past Surgical History:  Procedure Laterality Date   CATARACT EXTRACTION W/PHACO Left 06/22/2020   Procedure: CATARACT EXTRACTION PHACO AND INTRAOCULAR LENS PLACEMENT (IOC) LEFT DIABETIC 4.81 00:35.4;  Surgeon: Eulogio Bear, MD;  Location: [place];  Service: Ophthalmology;  Laterality: Left;  Diabetic - oral meds   CATARACT EXTRACTION W/PHACO Right 08/03/2020   Procedure: CATARACT EXTRACTION PHACO AND  INTRAOCULAR LENS PLACEMENT (IOC) RIGHT DIABETIC 3.61 00:28.8;  Surgeon: Eulogio Bear, MD;  Location: [place];  Service: Ophthalmology;  Laterality: Right;   FRACTURE SURGERY     leg     Family History No bleeding disorders, clotting disorders, aneurysms, or autoimmune diseases   Social History   Tobacco Use   Smoking status: Former    Packs/day: 1.00    Years: 30.00    Pack years: 30.00    Types: Cigarettes    Quit date: 12/30/2018    Years since quitting: 1.8   Smokeless tobacco: Never  Vaping Use   Vaping Use: Never used  Substance Use Topics   Alcohol use: Not Currently    Alcohol/week: 0.0 standard drinks     No Known Allergies  Current Outpatient Medications  Medication Sig Dispense Refill   amLODipine (NORVASC) 10 MG tablet Take 10 mg by mouth daily.     ASPIRIN 81 PO Take by mouth daily.     hydrochlorothiazide (HYDRODIURIL) 25 MG tablet Take 25 mg by mouth daily.     lisinopril (ZESTRIL) 40 MG tablet Take 40 mg by mouth daily.     metFORMIN (GLUCOPHAGE) 1000 MG tablet Take 1,000 mg by mouth 2 (two) times daily with a meal.     Omega-3 Fatty Acids (FISH OIL) 1000 MG CAPS Take by mouth in the morning and at  bedtime.     pravastatin (PRAVACHOL) 40 MG tablet Take 40 mg by mouth daily.     No current facility-administered medications for this visit.      REVIEW OF SYSTEMS (Negative unless checked)  Constitutional: '[]'$ Weight loss  '[]'$ Fever  '[]'$ Chills Cardiac: '[]'$ Chest pain   '[]'$ Chest pressure   '[]'$ Palpitations   '[]'$ Shortness of breath when laying flat   '[]'$ Shortness of breath at rest   '[]'$ Shortness of breath with exertion. Vascular:  '[]'$ Pain in legs with walking   '[]'$ Pain in legs at rest   '[]'$ Pain in legs when laying flat   '[]'$ Claudication   '[]'$ Pain in feet when walking  '[]'$ Pain in feet at rest  '[]'$ Pain in feet when laying flat   '[]'$ History of DVT   '[]'$ Phlebitis   '[]'$ Swelling in legs   '[]'$ Varicose veins   '[]'$ Non-healing ulcers Pulmonary:   '[]'$ Uses home oxygen    '[]'$ Productive cough   '[]'$ Hemoptysis   '[]'$ Wheeze  '[]'$ COPD   '[]'$ Asthma Neurologic:  '[]'$ Dizziness  '[]'$ Blackouts   '[]'$ Seizures   '[x]'$ History of stroke   '[]'$ History of TIA  '[]'$ Aphasia   '[]'$ Temporary blindness   '[]'$ Dysphagia   '[x]'$ Weakness or numbness in arms   '[x]'$ Weakness or numbness in legs Musculoskeletal:  '[x]'$ Arthritis   '[]'$ Joint swelling   '[]'$ Joint pain   '[]'$ Low back pain Hematologic:  '[]'$ Easy bruising  '[]'$ Easy bleeding   '[]'$ Hypercoagulable state   '[]'$ Anemic  '[]'$ Hepatitis Gastrointestinal:  '[]'$ Blood in stool   '[]'$ Vomiting blood  '[]'$ Gastroesophageal reflux/heartburn   '[]'$ Abdominal pain Genitourinary:  '[]'$ Chronic kidney disease   '[]'$ Difficult urination  '[]'$ Frequent urination  '[]'$ Burning with urination   '[]'$ Hematuria Skin:  '[]'$ Rashes   '[]'$ Ulcers   '[]'$ Wounds Psychological:  '[]'$ History of anxiety   '[]'$  History of major depression.    Physical Exam BP 118/66 (BP Location: Left Arm)   Pulse 66   Resp 16   Ht '5\' 8"'$  (1.727 m)   Wt 195 lb 3.2 oz (88.5 kg)   BMI 29.68 kg/m  Gen:  WD/WN, NAD Head: [place]/AT, No temporalis wasting. Ear/Nose/Throat: Hearing grossly intact, nares w/o erythema or drainage, oropharynx w/o Erythema/Exudate Eyes: Conjunctiva clear, sclera non-icteric  Neck: trachea midline.  No JVD.  Pulmonary:  Good air movement, respirations not labored, no use of accessory muscles  Cardiac: RRR, no JVD Vascular:  Vessel Right Left  Radial Palpable Palpable                                   Gastrointestinal:. No masses, surgical incisions, or scars. Musculoskeletal: Mild left-sided weakness although strength is nearly intact.  Extremities without ischemic changes.  No deformity or atrophy.  No significant lower extremity edema. Neurologic: Sensation grossly intact in extremities.  Symmetrical.  Speech is fluent. Motor exam as listed above. Psychiatric: Judgment intact, Mood & affect appropriate for pt's clinical situation. Dermatologic: No rashes or ulcers noted.  No cellulitis or open wounds.    Radiology CT  CHEST LUNG CA SCREEN LOW DOSE W/O CM  Result Date: 10/01/2020 CLINICAL DATA:  70 year old male former smoker (quit 3 years ago) with 30 pack-year history of smoking. Lung cancer screening examination. EXAM: CT CHEST WITHOUT CONTRAST LOW-DOSE FOR LUNG CANCER SCREENING TECHNIQUE: Multidetector CT imaging of the chest was performed following the standard protocol without IV contrast. COMPARISON:  Low-dose lung cancer screening chest CT 09/12/2019. FINDINGS: Cardiovascular: Heart size is normal. There is no significant pericardial fluid, thickening or pericardial calcification. There is aortic atherosclerosis, as well as  atherosclerosis of the great vessels of the mediastinum and the coronary arteries, including calcified atherosclerotic plaque in the left main, left anterior descending, left circumflex and right coronary arteries. Mediastinum/Nodes: No pathologically enlarged mediastinal or hilar lymph nodes. Please note that accurate exclusion of hilar adenopathy is limited on noncontrast CT scans. Esophagus is unremarkable in appearance. No axillary lymphadenopathy. Lungs/Pleura: Tiny calcified granulomas are noted in the right lung. No other suspicious appearing pulmonary nodules or masses are noted. No acute consolidative airspace disease. No pleural effusions. Mild diffuse bronchial wall thickening with very mild centrilobular and paraseptal emphysema. Upper Abdomen: Aortic atherosclerosis. In the region of the left renal hilum there is a 2.0 x 1.6 cm intermediate attenuation lesion with some peripheral calcification, highly concerning for a left renal artery aneurysm (axial image 67 of series 2). Musculoskeletal: There are no aggressive appearing lytic or blastic lesions noted in the visualized portions of the skeleton. IMPRESSION: 1. Lung-RADS 2S, benign appearance or behavior. Continue annual screening with low-dose chest CT without contrast in 12 months. 2. The "S" modifier above refers to potentially  clinically significant non lung cancer related findings. Specifically, there is a structure in the left retroperitoneum which is highly suspicious for a renal artery aneurysm measuring 2.0 x 1.6 cm. Further evaluation with CTA of the abdomen is suggested in the near future to better evaluate this finding. 3. Aortic atherosclerosis, in addition to left main and 3 vessel coronary artery disease. Assessment for potential risk factor modification, dietary therapy or pharmacologic therapy may be warranted, if clinically indicated. 4. Mild diffuse bronchial wall thickening with very mild centrilobular and paraseptal emphysema; imaging findings suggestive of underlying COPD. Aortic Atherosclerosis (ICD10-I70.0) and Emphysema (ICD10-J43.9). Electronically Signed   By: Vinnie Langton M.D.   On: 10/01/2020 15:08    Labs Recent Results (from the past 2160 hour(s))  Glucose, capillary     Status: None   Collection Time: 08/03/20  6:56 AM  Result Value Ref Range   Glucose-Capillary 99 70 - 99 mg/dL    Comment: Glucose reference range applies only to samples taken after fasting for at least 8 hours.  Glucose, capillary     Status: Abnormal   Collection Time: 08/03/20  8:30 AM  Result Value Ref Range   Glucose-Capillary 117 (H) 70 - 99 mg/dL    Comment: Glucose reference range applies only to samples taken after fasting for at least 8 hours.  Glucose, capillary     Status: Abnormal   Collection Time: 08/03/20  9:00 AM  Result Value Ref Range   Glucose-Capillary 149 (H) 70 - 99 mg/dL    Comment: Glucose reference range applies only to samples taken after fasting for at least 8 hours.    Assessment/Plan:  Renal artery aneurysm ([place]) I have independently reviewed the CT scan of the chest from last month.  This demonstrates a left renal artery aneurysm approaching 2 cm in diameter and what appeared to be the left main renal artery or at the primary bifurcation.  This is an uninfused chest CT so the imaging is  extremely limited.  In looking back, this had a similar appearance back in 2020.  It was not well seen on the imaging from 2021, but it is likely that the scan was a few inches higher and just missed the aneurysm.  Again, this was a chest CT. Given these findings, I had a long discussion today regarding treatment of renal artery aneurysms with the patient.  In a patient over 56  without significant hypertension or renal dysfunction, immediate repair would not be performed unless this were in excess of 2.5 cm.  Should he have renovascular hypertension which is severe or other symptoms, repair could be considered.  At this point, I would recommend a 56-monthfollow-up with a CT angiogram of the abdomen and pelvis for further evaluation.  This will give [place] much better view of the aneurysm.  The fact that this was seen on scans a couple of years ago is also encouraging that has not had dramatic growth.  The patient voices understanding and is agreeable with our plan of care.  Hypertension blood pressure control important in reducing the progression of atherosclerotic disease and aneurysmal growth. On appropriate oral medications.   Type 2 diabetes mellitus with diabetic nephropathy (HCC) blood glucose control important in reducing the progression of atherosclerotic disease. Also, involved in wound healing. On appropriate medications.      JLeotis Pain8/11/2020, 9:52 AM   This note was created with Dragon medical transcription system.  Any errors from dictation are unintentional.

## 2020-10-20 NOTE — Assessment & Plan Note (Signed)
blood glucose control important in reducing the progression of atherosclerotic disease. Also, involved in wound healing. On appropriate medications.  

## 2021-01-25 DIAGNOSIS — E1121 Type 2 diabetes mellitus with diabetic nephropathy: Secondary | ICD-10-CM | POA: Diagnosis not present

## 2021-01-25 DIAGNOSIS — I10 Essential (primary) hypertension: Secondary | ICD-10-CM | POA: Diagnosis not present

## 2021-01-25 DIAGNOSIS — E039 Hypothyroidism, unspecified: Secondary | ICD-10-CM | POA: Diagnosis not present

## 2021-01-25 DIAGNOSIS — E785 Hyperlipidemia, unspecified: Secondary | ICD-10-CM | POA: Diagnosis not present

## 2021-01-25 DIAGNOSIS — D649 Anemia, unspecified: Secondary | ICD-10-CM | POA: Diagnosis not present

## 2021-02-01 DIAGNOSIS — Z23 Encounter for immunization: Secondary | ICD-10-CM | POA: Diagnosis not present

## 2021-02-01 DIAGNOSIS — E785 Hyperlipidemia, unspecified: Secondary | ICD-10-CM | POA: Diagnosis not present

## 2021-02-01 DIAGNOSIS — E1169 Type 2 diabetes mellitus with other specified complication: Secondary | ICD-10-CM | POA: Diagnosis not present

## 2021-02-01 DIAGNOSIS — I2510 Atherosclerotic heart disease of native coronary artery without angina pectoris: Secondary | ICD-10-CM | POA: Diagnosis not present

## 2021-02-01 DIAGNOSIS — E1121 Type 2 diabetes mellitus with diabetic nephropathy: Secondary | ICD-10-CM | POA: Diagnosis not present

## 2021-02-01 DIAGNOSIS — Z87891 Personal history of nicotine dependence: Secondary | ICD-10-CM | POA: Diagnosis not present

## 2021-02-01 DIAGNOSIS — I10 Essential (primary) hypertension: Secondary | ICD-10-CM | POA: Diagnosis not present

## 2021-02-01 DIAGNOSIS — I700 Atherosclerosis of aorta: Secondary | ICD-10-CM | POA: Diagnosis not present

## 2021-02-01 DIAGNOSIS — I722 Aneurysm of renal artery: Secondary | ICD-10-CM | POA: Diagnosis not present

## 2021-04-08 DIAGNOSIS — E119 Type 2 diabetes mellitus without complications: Secondary | ICD-10-CM | POA: Diagnosis not present

## 2021-04-20 ENCOUNTER — Encounter (INDEPENDENT_AMBULATORY_CARE_PROVIDER_SITE_OTHER): Payer: Self-pay

## 2021-04-20 ENCOUNTER — Other Ambulatory Visit: Payer: Self-pay

## 2021-04-20 ENCOUNTER — Ambulatory Visit (INDEPENDENT_AMBULATORY_CARE_PROVIDER_SITE_OTHER): Payer: Medicare HMO | Admitting: Vascular Surgery

## 2021-05-31 ENCOUNTER — Telehealth (INDEPENDENT_AMBULATORY_CARE_PROVIDER_SITE_OTHER): Payer: Self-pay | Admitting: *Deleted

## 2021-05-31 NOTE — Telephone Encounter (Addendum)
Called the patient to let him know that his CT scan was approved and he can schedule his appointment. Gave him [place] number and told him to make sure it is done before 06/30/21 as that is the last day of his auth period. Also advised him that we will see him after that appointment is scheduled. ? ?Auth# [id number] good 05/31/21-06/30/21 ?

## 2021-06-15 ENCOUNTER — Ambulatory Visit: Admission: RE | Admit: 2021-06-15 | Payer: Medicare HMO | Source: Ambulatory Visit

## 2021-07-14 ENCOUNTER — Ambulatory Visit
Admission: RE | Admit: 2021-07-14 | Discharge: 2021-07-14 | Disposition: A | Discharge status: Home / Self Care | Payer: Medicare HMO | Source: Ambulatory Visit | Attending: Vascular Surgery | Admitting: Vascular Surgery

## 2021-07-14 DIAGNOSIS — I722 Aneurysm of renal artery: Secondary | ICD-10-CM | POA: Diagnosis not present

## 2021-07-14 DIAGNOSIS — K449 Diaphragmatic hernia without obstruction or gangrene: Secondary | ICD-10-CM | POA: Diagnosis not present

## 2021-07-14 LAB — POCT I-STAT CREATININE: Creatinine, Ser: 1 mg/dL (ref 0.61–1.24)

## 2021-07-14 MED ORDER — IOHEXOL 350 MG/ML SOLN
100.0000 mL | Freq: Once | INTRAVENOUS | Status: AC | PRN
  Administered 2021-07-14: 100 mL via INTRAVENOUS

## 2021-07-27 DIAGNOSIS — I2510 Atherosclerotic heart disease of native coronary artery without angina pectoris: Secondary | ICD-10-CM | POA: Diagnosis not present

## 2021-07-27 DIAGNOSIS — I700 Atherosclerosis of aorta: Secondary | ICD-10-CM | POA: Diagnosis not present

## 2021-07-27 DIAGNOSIS — E785 Hyperlipidemia, unspecified: Secondary | ICD-10-CM | POA: Diagnosis not present

## 2021-07-27 DIAGNOSIS — E1121 Type 2 diabetes mellitus with diabetic nephropathy: Secondary | ICD-10-CM | POA: Diagnosis not present

## 2021-07-27 DIAGNOSIS — Z125 Encounter for screening for malignant neoplasm of prostate: Secondary | ICD-10-CM | POA: Diagnosis not present

## 2021-08-03 DIAGNOSIS — E1129 Type 2 diabetes mellitus with other diabetic kidney complication: Secondary | ICD-10-CM | POA: Diagnosis not present

## 2021-08-03 DIAGNOSIS — E1169 Type 2 diabetes mellitus with other specified complication: Secondary | ICD-10-CM | POA: Diagnosis not present

## 2021-08-03 DIAGNOSIS — I10 Essential (primary) hypertension: Secondary | ICD-10-CM | POA: Diagnosis not present

## 2021-08-03 DIAGNOSIS — Z0000 Encounter for general adult medical examination without abnormal findings: Secondary | ICD-10-CM | POA: Diagnosis not present

## 2021-08-03 DIAGNOSIS — R809 Proteinuria, unspecified: Secondary | ICD-10-CM | POA: Diagnosis not present

## 2021-08-03 DIAGNOSIS — L409 Psoriasis, unspecified: Secondary | ICD-10-CM | POA: Diagnosis not present

## 2021-08-03 DIAGNOSIS — Z1389 Encounter for screening for other disorder: Secondary | ICD-10-CM | POA: Diagnosis not present

## 2021-08-03 DIAGNOSIS — Z87891 Personal history of nicotine dependence: Secondary | ICD-10-CM | POA: Diagnosis not present

## 2021-08-03 DIAGNOSIS — I700 Atherosclerosis of aorta: Secondary | ICD-10-CM | POA: Diagnosis not present

## 2021-08-03 DIAGNOSIS — I722 Aneurysm of renal artery: Secondary | ICD-10-CM | POA: Diagnosis not present

## 2021-08-11 ENCOUNTER — Encounter: Payer: Self-pay | Admitting: Urology

## 2021-08-11 ENCOUNTER — Ambulatory Visit: Payer: Medicare HMO | Admitting: Urology

## 2021-08-11 VITALS — BP 125/75 | HR 82 | Ht 68.0 in | Wt 197.0 lb

## 2021-08-11 DIAGNOSIS — I722 Aneurysm of renal artery: Secondary | ICD-10-CM

## 2021-08-11 DIAGNOSIS — N3289 Other specified disorders of bladder: Secondary | ICD-10-CM

## 2021-08-11 NOTE — Patient Instructions (Signed)

## 2021-08-11 NOTE — Progress Notes (Signed)
08/11/21 8:18 AM   Phillip Kent. [birth year] [id number]  Referring provider:  Adin Hector, MD [place] [facility] [place],  [employer] [address] Chief Complaint  Patient presents with   bladder wall thickening    HPI: Phillip Kent. is a 71 y.o.male who presents today for further evaluation of abnormal radiographic findings.   He underwent a CT angio abdomen and pelvis on 07/14/2021 to further evaluate possible renal artery abnormality. It visualized left renal artery aneurysm estimated 18 mm, arising from the most superior renal artery, similar in diameter to the most remote comparison noncontrast chest CT dated 09/11/2018.There is focal wall thickening of the urinary bladder at the superior/anterior aspect, near the remnant urachus.  Bladder wall malignancy cannot be excluded.   He reports today that he used to smoke x1 pack daily for about 20 years. He has stopped smoking for about 1 year he does however intermittently smoke.   He has no urinary symptoms.  He denies any dysuria or gross hematuria.  He denies any issues with his bellybutton, abdominal pain, or drainage from his abdominal wall.  He does have a small umbilical hernia as well as rectus diastases.   PMH: Past Medical History:  Diagnosis Date   Hypertension    Personal history of tobacco use, presenting hazards to health 07/17/2015   Stroke (Phillip Kent)    10-15 yrs ago. Occasional hand/foot numbness   Tobacco abuse 07/23/2014   Type 2 diabetes mellitus ([place])    Wears dentures    partiel upper, full lower    Surgical History: Past Surgical History:  Procedure Laterality Date   CATARACT EXTRACTION W/PHACO Left 06/22/2020   Procedure: CATARACT EXTRACTION PHACO AND INTRAOCULAR LENS PLACEMENT (IOC) LEFT DIABETIC 4.81 00:35.4;  Surgeon: Eulogio Bear, MD;  Location: [place];  Service: Ophthalmology;  Laterality: Left;  Diabetic - oral meds   CATARACT EXTRACTION W/PHACO  Right 08/03/2020   Procedure: CATARACT EXTRACTION PHACO AND INTRAOCULAR LENS PLACEMENT (IOC) RIGHT DIABETIC 3.61 00:28.8;  Surgeon: Eulogio Bear, MD;  Location: [place];  Service: Ophthalmology;  Laterality: Right;   FRACTURE SURGERY     leg    Home Medications:  Allergies as of 08/11/2021   No Known Allergies      Medication List        Accurate as of Aug 11, 2021 11:59 PM. If you have any questions, ask your nurse or doctor.          amLODipine 10 MG tablet Commonly known as: NORVASC Take 10 mg by mouth daily.   ASPIRIN 81 PO Take by mouth daily.   Fish Oil 1000 MG Caps Take by mouth in the morning and at bedtime.   hydrochlorothiazide 25 MG tablet Commonly known as: HYDRODIURIL Take 25 mg by mouth daily.   levothyroxine 25 MCG tablet Commonly known as: SYNTHROID Take 25 mcg by mouth daily.   lisinopril 40 MG tablet Commonly known as: ZESTRIL Take 40 mg by mouth daily.   metFORMIN 1000 MG tablet Commonly known as: GLUCOPHAGE Take 1,000 mg by mouth 2 (two) times daily with a meal.   pravastatin 40 MG tablet Commonly known as: PRAVACHOL Take 40 mg by mouth daily.        Allergies:  No Known Allergies  Family History: Family History  Problem Relation Age of Onset   Congestive Heart Failure Mother    Heart attack Paternal Uncle    Stroke Paternal Grandmother  Social History:  reports that he quit smoking about 2 years ago. His smoking use included cigarettes. He has a 30.00 pack-year smoking history. He has never used smokeless tobacco. He reports that he does not currently use alcohol. No history on file for drug use.   Physical Exam: BP 125/75   Pulse 82   Ht '5\' 8"'$  (1.727 m)   Wt 197 lb (89.4 kg)   BMI 29.95 kg/m   Constitutional:  Alert and oriented, No acute distress. HEENT: Phillip Kent AT, moist mucus membranes.  Trachea midline, no masses. Cardiovascular: No clubbing, cyanosis, or edema. Respiratory: Normal respiratory  effort, no increased work of breathing. Abdomen: 1 cm fascial defect in umbilicus but otherwise no masses or drainage. Skin: No rashes, bruises or suspicious lesions. Neurologic: Grossly intact, no focal deficits, moving all 4 extremities. Psychiatric: Normal mood and affect.  Laboratory Data:  Lab Results  Component Value Date   CREATININE 1.00 07/14/2021   Lab Results  Component Value Date   HGBA1C (H) 08/14/2009    7.1 (NOTE)                                                                       According to the ADA Clinical Practice Recommendations for 2011, when HbA1c is used as a screening test:   >=6.5%   Diagnostic of Diabetes Mellitus           (if abnormal result  is confirmed)  5.7-6.4%   Increased risk of developing Diabetes Mellitus  References:Diagnosis and Classification of Diabetes Mellitus,Diabetes KCLE,7517,00(FVCBS 1):S62-S69 and Standards of Medical Care in         Diabetes - 2011,Diabetes Care,2011,34  (Suppl 1):S11-S61.    Urinalysis pending   Pertinent Imaging: CLINICAL DATA:  71 year old male with a history of possible renal artery abnormality   EXAM: CTA ABDOMEN AND PELVIS WITHOUT AND WITH CONTRAST   TECHNIQUE: Multidetector CT imaging of the abdomen and pelvis was performed using the standard protocol during bolus administration of intravenous contrast. Multiplanar reconstructed images and MIPs were obtained and reviewed to evaluate the vascular anatomy.   RADIATION DOSE REDUCTION: This exam was performed according to the departmental dose-optimization program which includes automated exposure control, adjustment of the mA and/or kV according to patient size and/or use of iterative reconstruction technique.   CONTRAST:  149m OMNIPAQUE IOHEXOL 350 MG/ML SOLN   COMPARISON:  Noncontrast chest CT 09/11/2018   FINDINGS: VASCULAR   Aorta: Unremarkable course, caliber, contour of the abdominal aorta. No dissection, aneurysm, or periaortic fluid.  Mild atherosclerosis.   Celiac: Mild atherosclerosis of the celiac artery which is patent. No high-grade stenosis. Branches are patent.   SMA: SMA patent with mild atherosclerosis.  Branches are patent.   Renals:   - Right: Single right renal artery with mild atherosclerosis at the origin. Normal course caliber and contour.   - Left: There are 2 left renal arteries. No significant atherosclerotic changes, with no high-grade stenosis.   Aneurysm of the more cranial of the left renal artery at the division into the rami. Greatest axial dimension estimated 18 mm. Minimal mural calcifications. The most remote noncontrast comparison is from the CT dated 09/11/2018, when the diameter is essentially the same, estimated 18 mm.  IMA: Inferior mesenteric artery is patent.   Right lower extremity:   Unremarkable course, caliber, and contour of the right iliac system. Mild atherosclerosis. No aneurysm, dissection, or occlusion. Hypogastric artery is patent. Common femoral artery patent. Proximal SFA and profunda femoris patent.   Left lower extremity:   Unremarkable course, caliber, and contour of the left iliac system. Mild atherosclerosis. No aneurysm, dissection, or occlusion. Hypogastric artery is patent. Common femoral artery patent. Proximal SFA and profunda femoris patent.   Veins: Unremarkable appearance of the venous system.   Review of the MIP images confirms the above findings.   NON-VASCULAR   Lower chest: No acute.   Hepatobiliary: Unremarkable appearance of the liver. Minimal hyperdense debris layered in the gallbladder lumen compatible with cholelithiasis. No inflammatory changes.   Pancreas: Unremarkable.   Spleen: Unremarkable.   Adrenals/Urinary Tract:   - Right adrenal gland: Unremarkable   - Left adrenal gland: Unremarkable.   - Right kidney: No hydronephrosis, nephrolithiasis, inflammation, or ureteral dilation. No focal lesion.   - Left Kidney:  No hydronephrosis, nephrolithiasis, inflammation, or ureteral dilation. No focal lesion.   - Urinary Bladder: Urinary bladder wall thickening at the superior anterior bladder wall, near the remnant urachus.   Stomach/Bowel:   - Stomach: Small hiatal hernia.  Otherwise unremarkable stomach.   - Small bowel: Unremarkable   - Appendix: Normal.   - Colon: Left-sided colonic diverticular disease. No inflammatory changes.   Lymphatic: No adenopathy.   Mesenteric: No free fluid or air. No mesenteric adenopathy.   Reproductive: Prostate diameter estimated 5.1 cm.   Other: No hernia.   Musculoskeletal: Syndesmophytes of the visualized lower thoracic spine. Ankylosis of the right SI joint and partial ankylosis of the left SI joint. No displaced fracture. No bony canal narrowing. Mild degenerative disc disease of the lower thoracic and lumbar spine. Ossifications of the posterior longitudinal ligament of the spine.   IMPRESSION: Left renal artery aneurysm estimated 18 mm, arising from the most superior renal artery, similar in diameter to the most remote comparison noncontrast chest CT dated 09/11/2018.   There is focal wall thickening of the urinary bladder at the superior/anterior aspect, near the remnant urachus. En plaque bladder wall malignancy cannot be excluded and referral for urology evaluation may be useful for urine cytology and/or cystoscopy.   Aortic Atherosclerosis (ICD10-I70.0).   Ankylosing spondylitis.   Additional ancillary findings as above.   Signed,   Dulcy Fanny. Dellia Nims, RPVI   [facility]   [facility]     Electronically Signed   By: Corrie Mckusick D.O.   On: 07/14/2021 13:57   I have personally reviewed the images and agree with radiologist interpretation.   Assessment & Plan:    Bladder wall thickening  - High risk for bladder cancer based on smoking history, presence of urethral remnant also  somewhat worrisome and increases risk of bladder adenocarcinoma - Will further evaluate with cystoscopy to review bladder health a rule out any malignancy  - UA; pending    Schedule cystoscopy  Conley Rolls as a scribe for Hollice Espy, MD.,have documented all relevant documentation on the behalf of Hollice Espy, MD,as directed by  Hollice Espy, MD while in the presence of Hollice Espy, Phillip Kent [address], [place],  [id number] [phone]

## 2021-08-31 ENCOUNTER — Ambulatory Visit: Payer: Medicare HMO | Admitting: Urology

## 2021-08-31 VITALS — BP 160/74 | HR 73 | Ht 68.0 in | Wt 197.0 lb

## 2021-08-31 DIAGNOSIS — N3289 Other specified disorders of bladder: Secondary | ICD-10-CM | POA: Diagnosis not present

## 2021-08-31 DIAGNOSIS — Q644 Malformation of urachus: Secondary | ICD-10-CM

## 2021-08-31 DIAGNOSIS — I722 Aneurysm of renal artery: Secondary | ICD-10-CM

## 2021-08-31 DIAGNOSIS — E038 Other specified hypothyroidism: Secondary | ICD-10-CM | POA: Diagnosis not present

## 2021-08-31 LAB — URINALYSIS, COMPLETE
Bilirubin, UA: NEGATIVE
Glucose, UA: NEGATIVE
Ketones, UA: NEGATIVE
Leukocytes,UA: NEGATIVE
Nitrite, UA: NEGATIVE
Protein,UA: NEGATIVE
Specific Gravity, UA: 1.02 (ref 1.005–1.030)
Urobilinogen, Ur: 1 mg/dL (ref 0.2–1.0)
pH, UA: 7 (ref 5.0–7.5)

## 2021-08-31 LAB — MICROSCOPIC EXAMINATION: Bacteria, UA: NONE SEEN

## 2021-08-31 NOTE — Progress Notes (Signed)
   09/01/21 CC:  Chief Complaint  Patient presents with   Cysto    HPI: Phillip Kent. is a 71 y.o. male with a personal history of bladder wall thickening who presents today for diagnostic cystoscopy.   He underwent a CT angio abdomen and pelvis on 07/14/2021 to further evaluate possible renal artery abnormality. It visualized left renal artery aneurysm estimated 18 mm, arising from the most superior renal artery, similar in diameter to the most remote comparison noncontrast chest CT dated 09/11/2018.There is focal wall thickening of the urinary bladder at the superior/anterior aspect, near the remnant urachus.  Bladder wall malignancy cannot be excluded.    Vitals:   08/31/21 1425  BP: (!) 160/74  Pulse: 73   NED. A&Ox3.   No respiratory distress   Abd soft, NT, ND Normal phallus with bilateral descended testicles  Cystoscopy Procedure Note  Patient identification was confirmed, informed consent was obtained, and patient was prepped using Betadine solution.  Lidocaine jelly was administered per urethral meatus.     Pre-Procedure: - Inspection reveals a normal caliber ureteral meatus.  Procedure: The flexible cystoscope was introduced without difficulty - No urethral strictures/lesions are present. - Enlarged prostate bilobar coaptation  - Normal bladder neck - Bilateral ureteral orifices identified - Bladder mucosa  reveals no ulcers, tumors, or lesions - No bladder stones - mild trabeculation  Retroflexion shows slight intravesical protrusion but otherwise no discrete median lobe   Post-Procedure: - Patient tolerated the procedure well   Assessment/ Plan:  Abnormal urachus/ bladder wall thickening  - Cystoscopy is reassuring with no evidence of maligancy  -Will wait 3 months from last CT scan in May to re-image with CT of pelvis with contrast to assess stability, resolution or progression -Asymptomatic  Return in about 3 months (around 12/01/2021) for  CTscan results.  Conley Rolls as a Education administrator for Hollice Espy, MD.,have documented all relevant documentation on the behalf of Hollice Espy, MD,as directed by  Hollice Espy, MD while in the presence of Hollice Espy, MD.  I have reviewed the above documentation for accuracy and completeness, and I agree with the above.   Hollice Espy, MD

## 2021-09-30 ENCOUNTER — Ambulatory Visit
Admission: RE | Admit: 2021-09-30 | Discharge: 2021-09-30 | Disposition: A | Discharge status: Home / Self Care | Payer: Medicare HMO | Source: Ambulatory Visit | Attending: Acute Care | Admitting: Acute Care

## 2021-09-30 DIAGNOSIS — Z87891 Personal history of nicotine dependence: Secondary | ICD-10-CM | POA: Diagnosis not present

## 2021-10-04 ENCOUNTER — Other Ambulatory Visit: Payer: Self-pay | Admitting: Acute Care

## 2021-10-04 DIAGNOSIS — Z87891 Personal history of nicotine dependence: Secondary | ICD-10-CM

## 2021-10-04 DIAGNOSIS — Z122 Encounter for screening for malignant neoplasm of respiratory organs: Secondary | ICD-10-CM

## 2021-10-18 ENCOUNTER — Ambulatory Visit
Admission: RE | Admit: 2021-10-18 | Discharge: 2021-10-18 | Disposition: A | Discharge status: Home / Self Care | Payer: Medicare HMO | Source: Ambulatory Visit | Attending: Urology | Admitting: Urology

## 2021-10-18 DIAGNOSIS — I700 Atherosclerosis of aorta: Secondary | ICD-10-CM | POA: Diagnosis not present

## 2021-10-18 DIAGNOSIS — N3289 Other specified disorders of bladder: Secondary | ICD-10-CM | POA: Insufficient documentation

## 2021-10-18 DIAGNOSIS — M533 Sacrococcygeal disorders, not elsewhere classified: Secondary | ICD-10-CM | POA: Diagnosis not present

## 2021-10-18 DIAGNOSIS — Q644 Malformation of urachus: Secondary | ICD-10-CM | POA: Diagnosis not present

## 2021-10-18 LAB — POCT I-STAT CREATININE: Creatinine, Ser: 0.9 mg/dL (ref 0.61–1.24)

## 2021-10-18 MED ORDER — IOHEXOL 300 MG/ML  SOLN
100.0000 mL | Freq: Once | INTRAMUSCULAR | Status: AC | PRN
Start: 2021-10-18 — End: 2021-10-18
  Administered 2021-10-18: 100 mL via INTRAVENOUS

## 2021-10-20 ENCOUNTER — Telehealth: Payer: Self-pay | Admitting: *Deleted

## 2021-10-20 NOTE — Telephone Encounter (Addendum)
Patient informed, voiced understanding.Cancelled follow up   ----- Message from Hollice Espy, MD sent at 10/19/2021  9:25 AM EDT ----- CT scan has improved significantly and the bladder thickness has almost completely resolved.  This is great news.  I do not think any further follow-up is needed at this point in time.  Okay to cancel follow-up with me in light of this fairly normal CT scan.  Great news!  Hollice Espy, MD

## 2021-10-26 ENCOUNTER — Ambulatory Visit: Payer: Medicare HMO | Admitting: Urology

## 2022-01-10 ENCOUNTER — Encounter (INDEPENDENT_AMBULATORY_CARE_PROVIDER_SITE_OTHER): Payer: Self-pay

## 2022-02-01 DIAGNOSIS — I722 Aneurysm of renal artery: Secondary | ICD-10-CM | POA: Diagnosis not present

## 2022-02-01 DIAGNOSIS — I700 Atherosclerosis of aorta: Secondary | ICD-10-CM | POA: Diagnosis not present

## 2022-02-01 DIAGNOSIS — E785 Hyperlipidemia, unspecified: Secondary | ICD-10-CM | POA: Diagnosis not present

## 2022-02-01 DIAGNOSIS — E1121 Type 2 diabetes mellitus with diabetic nephropathy: Secondary | ICD-10-CM | POA: Diagnosis not present

## 2022-02-01 DIAGNOSIS — E038 Other specified hypothyroidism: Secondary | ICD-10-CM | POA: Diagnosis not present

## 2022-02-01 DIAGNOSIS — R808 Other proteinuria: Secondary | ICD-10-CM | POA: Diagnosis not present

## 2022-02-08 DIAGNOSIS — R809 Proteinuria, unspecified: Secondary | ICD-10-CM | POA: Diagnosis not present

## 2022-02-08 DIAGNOSIS — I10 Essential (primary) hypertension: Secondary | ICD-10-CM | POA: Diagnosis not present

## 2022-02-08 DIAGNOSIS — Z720 Tobacco use: Secondary | ICD-10-CM | POA: Diagnosis not present

## 2022-02-08 DIAGNOSIS — Z8673 Personal history of transient ischemic attack (TIA), and cerebral infarction without residual deficits: Secondary | ICD-10-CM | POA: Diagnosis not present

## 2022-02-08 DIAGNOSIS — E785 Hyperlipidemia, unspecified: Secondary | ICD-10-CM | POA: Diagnosis not present

## 2022-02-08 DIAGNOSIS — E1129 Type 2 diabetes mellitus with other diabetic kidney complication: Secondary | ICD-10-CM | POA: Diagnosis not present

## 2022-02-08 DIAGNOSIS — I700 Atherosclerosis of aorta: Secondary | ICD-10-CM | POA: Diagnosis not present

## 2022-02-08 DIAGNOSIS — I722 Aneurysm of renal artery: Secondary | ICD-10-CM | POA: Diagnosis not present

## 2022-04-14 DIAGNOSIS — H40003 Preglaucoma, unspecified, bilateral: Secondary | ICD-10-CM | POA: Diagnosis not present

## 2022-04-14 DIAGNOSIS — E119 Type 2 diabetes mellitus without complications: Secondary | ICD-10-CM | POA: Diagnosis not present

## 2022-04-14 DIAGNOSIS — Z961 Presence of intraocular lens: Secondary | ICD-10-CM | POA: Diagnosis not present

## 2022-04-14 DIAGNOSIS — Z0100 Encounter for examination of eyes and vision without abnormal findings: Secondary | ICD-10-CM | POA: Diagnosis not present

## 2022-05-08 IMAGING — CT CT CHEST LUNG CANCER SCREENING LOW DOSE W/O CM
2 of 5 series · 15 of 40 positions shown, 18 images · non-contrast
Comparison: Low-dose lung cancer screening chest CT 09/11/2018.

CLINICAL DATA: 69-year-old male former smoker (quit 3 years ago)
with 30 pack-year history of smoking.

EXAM:
CT CHEST WITHOUT CONTRAST LOW-DOSE FOR LUNG CANCER SCREENING
TECHNIQUE: Multidetector CT imaging of the chest was performed following the
standard protocol without IV contrast.

[Series 3: lung 1.00 · axial · 0.79mm/px · z∈[-1188,-883]mm · 12 of 339 slices shown, 15 images]
[im 17/339  mediastinal]
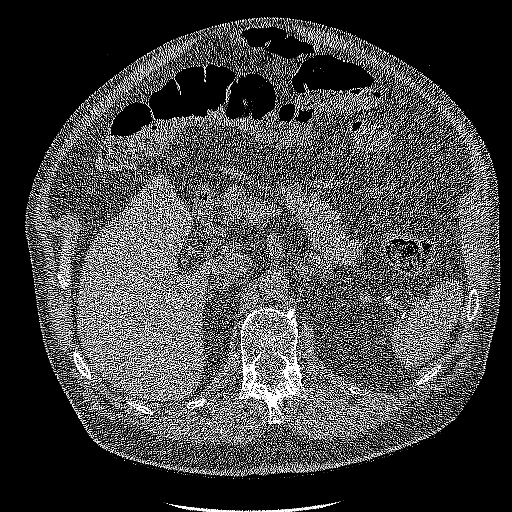
[im 17/339  lung]
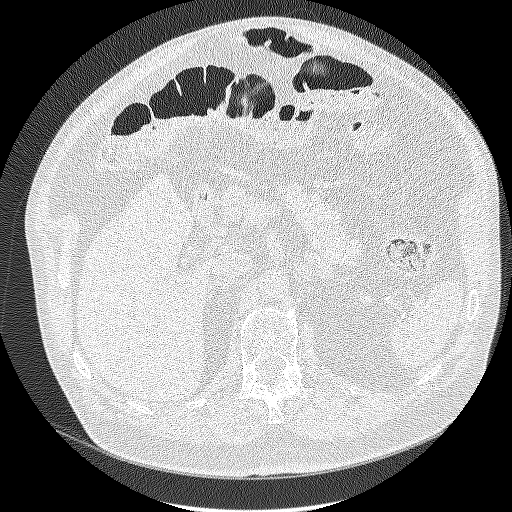
[im 49/339  lung]
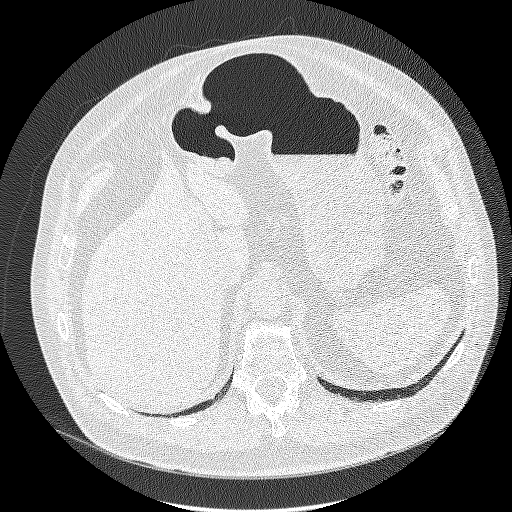
[im 81/339  lung]
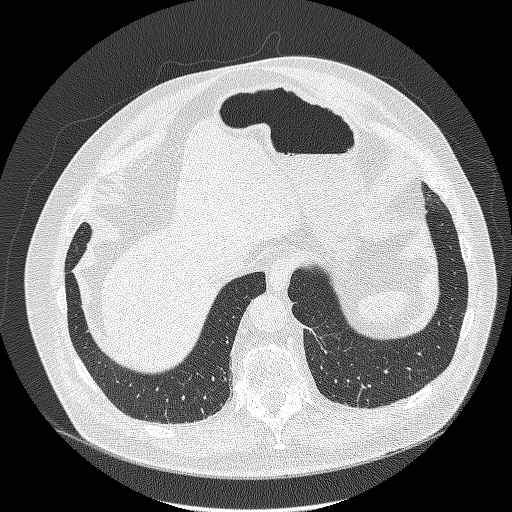
[im 97/339  lung]
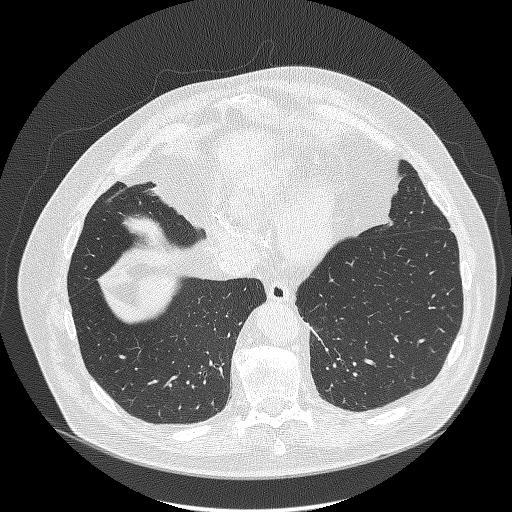
[im 129/339  mediastinal]
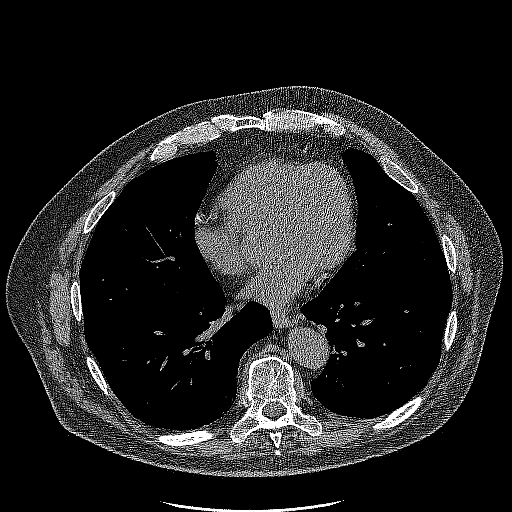
[im 129/339  lung]
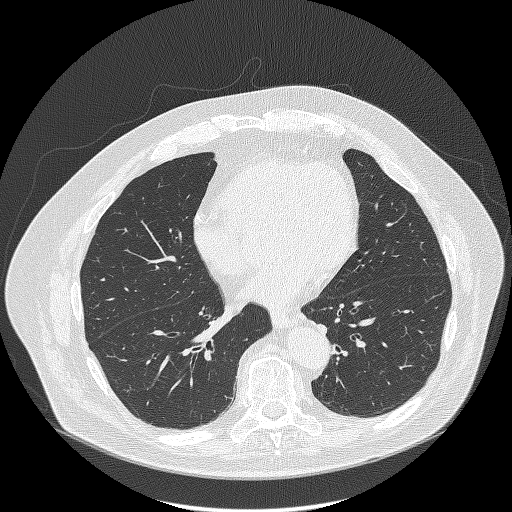
[im 161/339  lung]
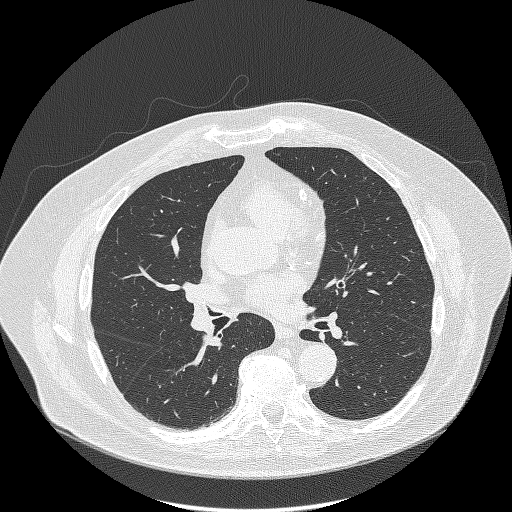
[im 178/339  lung]
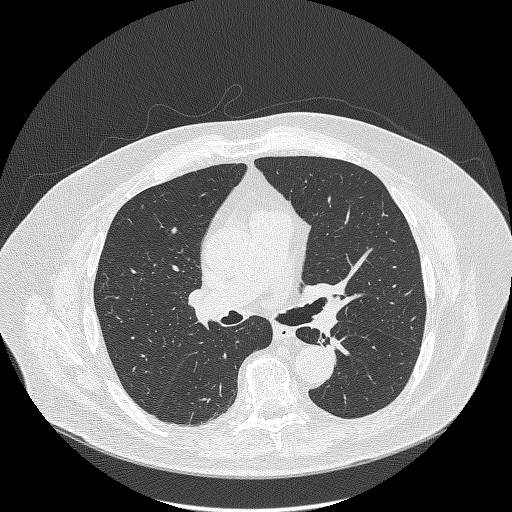
[im 210/339  lung]
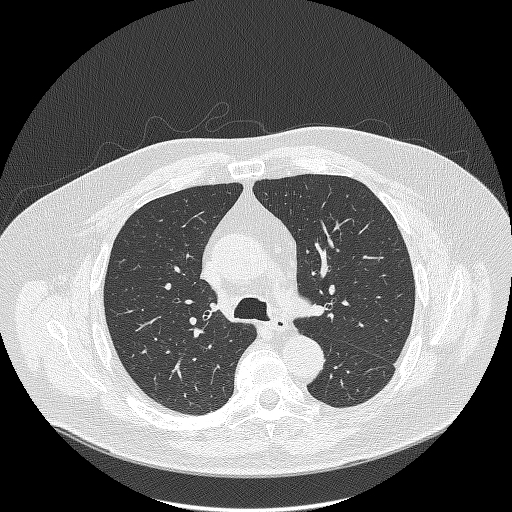
[im 242/339  mediastinal]
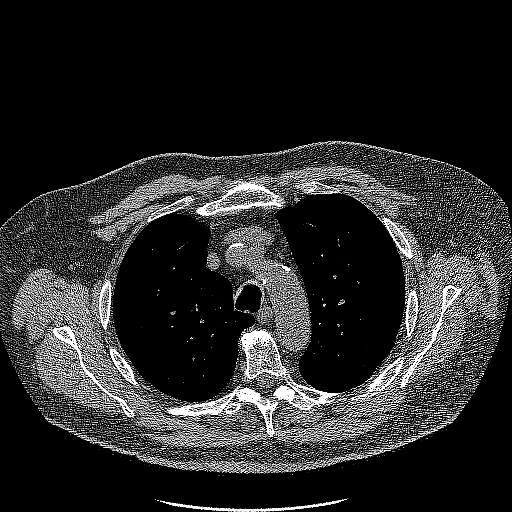
[im 242/339  lung]
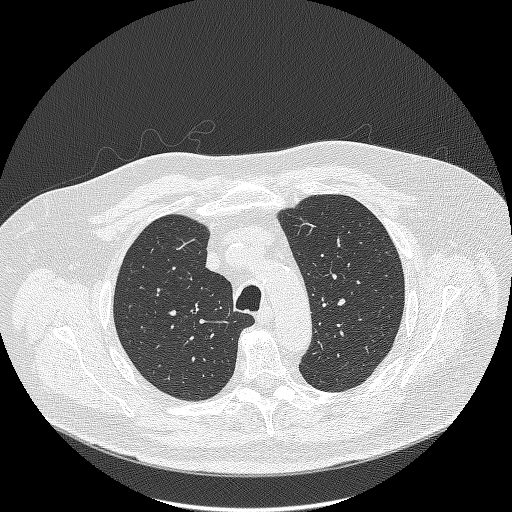
[im 258/339  lung]
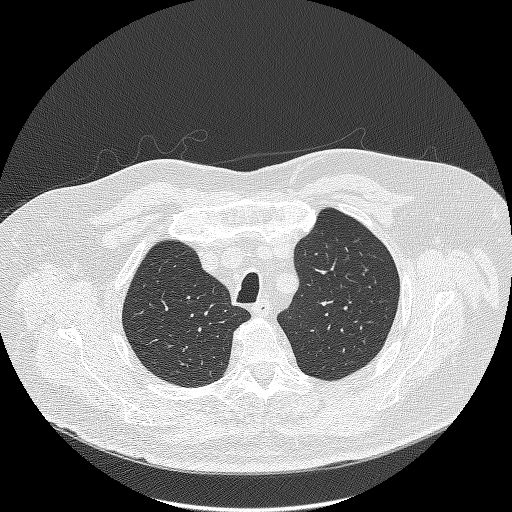
[im 290/339  lung]
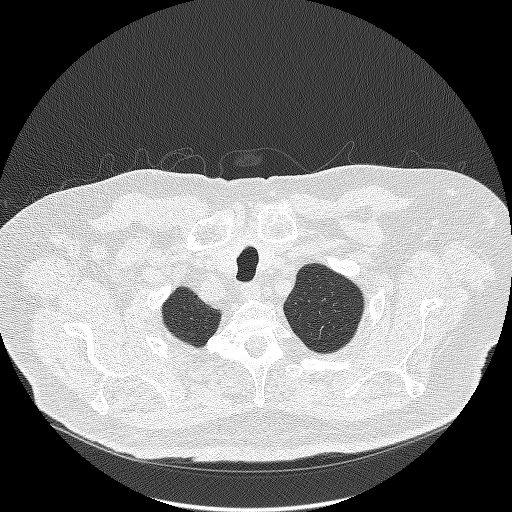
[im 322/339  lung]
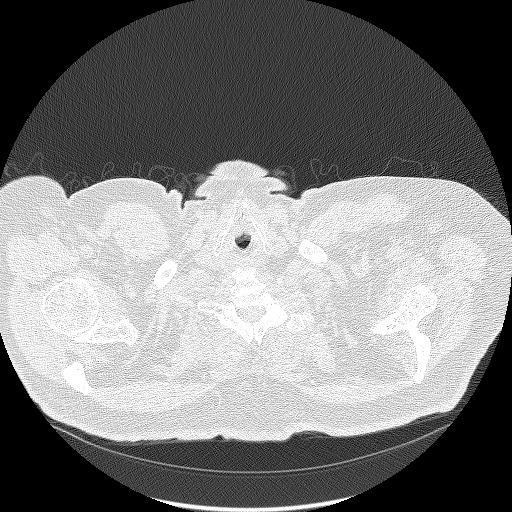

[Series 4: coronals lung 1.00 cor · coronal · 0.66mm/px · 3 of 361 slices shown]
[im 73/361  lung]
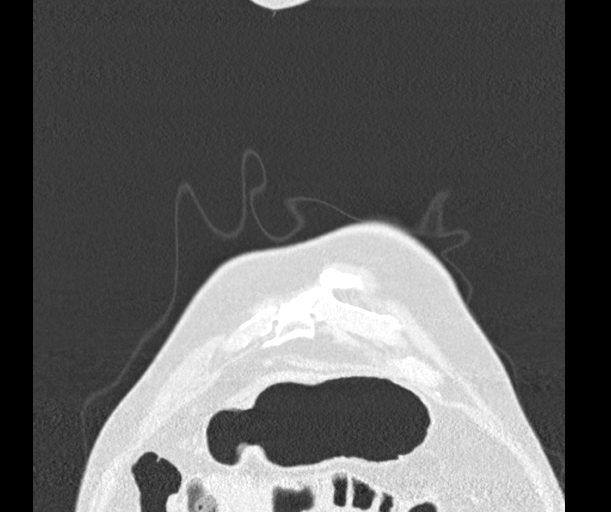
[im 145/361  lung]
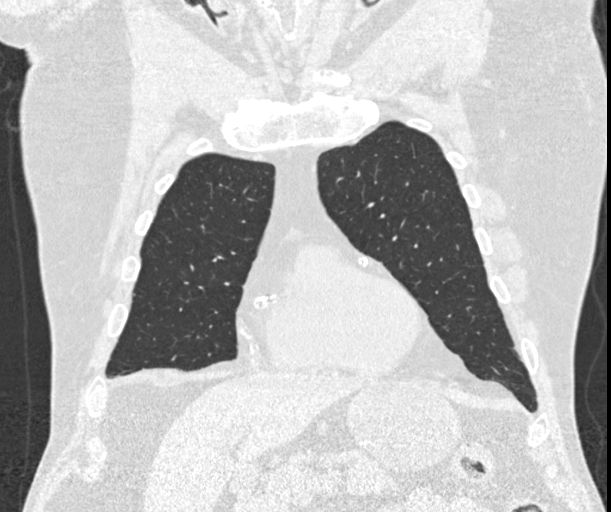
[im 217/361  lung]
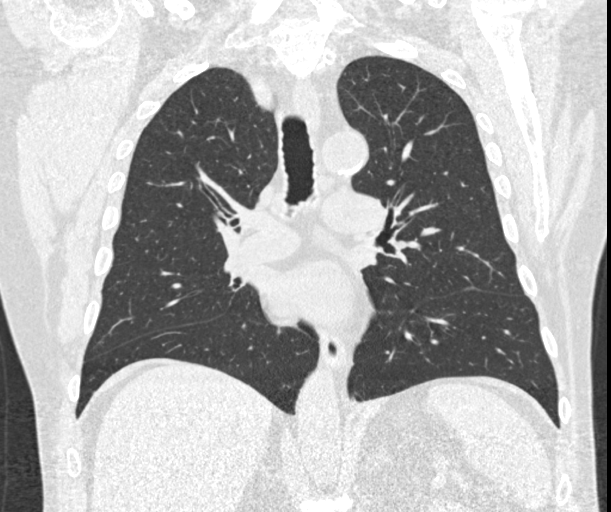

[15 of 40 positions shown; findings below may reference images not displayed]

FINDINGS: Cardiovascular: Heart size is normal. There is no significant
pericardial fluid, thickening or pericardial calcification. There is
aortic atherosclerosis, as well as atherosclerosis of the great
vessels of the mediastinum and the coronary arteries, including
calcified atherosclerotic plaque in the left main, left anterior
descending, left circumflex and right coronary arteries.

Mediastinum/Nodes: No pathologically enlarged mediastinal or hilar
lymph nodes. Please note that accurate exclusion of hilar adenopathy
is limited on noncontrast CT scans. Esophagus is unremarkable in
appearance. No axillary lymphadenopathy.

Lungs/Pleura: Tiny pulmonary nodule in the periphery of the right
upper lobe (axial image 76 of series 3), with a volume derived mean
diameter of only 1.7 mm. No other larger more suspicious appearing
pulmonary nodules or masses are noted. No acute consolidative
airspace disease. No pleural effusions. Diffuse bronchial wall
thickening with very mild centrilobular and paraseptal emphysema.

Upper Abdomen: Aortic atherosclerosis.

Musculoskeletal: There are no aggressive appearing lytic or blastic
lesions noted in the visualized portions of the skeleton.
IMPRESSION: 1. Lung-RADS 2S, benign appearance or behavior. Continue annual
screening with low-dose chest CT without contrast in 12 months.
2. The "S" modifier above refers to potentially clinically
significant non lung cancer related findings. Specifically, there is
aortic atherosclerosis, in addition to left main and 3 vessel
coronary artery disease. Please note that although the presence of
coronary artery calcium documents the presence of coronary artery
disease, the severity of this disease and any potential stenosis
cannot be assessed on this non-gated CT examination. Assessment for
potential risk factor modification, dietary therapy or pharmacologic
therapy may be warranted, if clinically indicated.
3. Mild diffuse bronchial wall thickening with very mild
centrilobular and paraseptal emphysema; imaging findings suggestive
of underlying COPD.

Aortic Atherosclerosis (J32M6-31G.G) and Emphysema ([id number]).

## 2022-08-04 DIAGNOSIS — R3129 Other microscopic hematuria: Secondary | ICD-10-CM | POA: Diagnosis not present

## 2022-08-04 DIAGNOSIS — Z720 Tobacco use: Secondary | ICD-10-CM | POA: Diagnosis not present

## 2022-08-04 DIAGNOSIS — E1121 Type 2 diabetes mellitus with diabetic nephropathy: Secondary | ICD-10-CM | POA: Diagnosis not present

## 2022-08-04 DIAGNOSIS — Z8673 Personal history of transient ischemic attack (TIA), and cerebral infarction without residual deficits: Secondary | ICD-10-CM | POA: Diagnosis not present

## 2022-08-04 DIAGNOSIS — E785 Hyperlipidemia, unspecified: Secondary | ICD-10-CM | POA: Diagnosis not present

## 2022-08-11 DIAGNOSIS — I722 Aneurysm of renal artery: Secondary | ICD-10-CM | POA: Diagnosis not present

## 2022-08-11 DIAGNOSIS — R809 Proteinuria, unspecified: Secondary | ICD-10-CM | POA: Diagnosis not present

## 2022-08-11 DIAGNOSIS — F17210 Nicotine dependence, cigarettes, uncomplicated: Secondary | ICD-10-CM | POA: Diagnosis not present

## 2022-08-11 DIAGNOSIS — E785 Hyperlipidemia, unspecified: Secondary | ICD-10-CM | POA: Diagnosis not present

## 2022-08-11 DIAGNOSIS — Z0000 Encounter for general adult medical examination without abnormal findings: Secondary | ICD-10-CM | POA: Diagnosis not present

## 2022-08-11 DIAGNOSIS — D649 Anemia, unspecified: Secondary | ICD-10-CM | POA: Diagnosis not present

## 2022-08-11 DIAGNOSIS — L409 Psoriasis, unspecified: Secondary | ICD-10-CM | POA: Diagnosis not present

## 2022-08-11 DIAGNOSIS — E1129 Type 2 diabetes mellitus with other diabetic kidney complication: Secondary | ICD-10-CM | POA: Diagnosis not present

## 2022-08-11 DIAGNOSIS — Z1331 Encounter for screening for depression: Secondary | ICD-10-CM | POA: Diagnosis not present

## 2022-08-11 DIAGNOSIS — I700 Atherosclerosis of aorta: Secondary | ICD-10-CM | POA: Diagnosis not present

## 2022-08-11 DIAGNOSIS — I10 Essential (primary) hypertension: Secondary | ICD-10-CM | POA: Diagnosis not present

## 2022-08-25 DIAGNOSIS — E039 Hypothyroidism, unspecified: Secondary | ICD-10-CM | POA: Diagnosis not present

## 2022-08-25 DIAGNOSIS — D649 Anemia, unspecified: Secondary | ICD-10-CM | POA: Diagnosis not present

## 2022-10-04 ENCOUNTER — Ambulatory Visit
Admission: RE | Admit: 2022-10-04 | Discharge: 2022-10-04 | Disposition: A | Discharge status: Home / Self Care | Payer: Medicare HMO | Source: Ambulatory Visit

## 2022-10-04 DIAGNOSIS — Z87891 Personal history of nicotine dependence: Secondary | ICD-10-CM | POA: Insufficient documentation

## 2022-10-04 DIAGNOSIS — Z122 Encounter for screening for malignant neoplasm of respiratory organs: Secondary | ICD-10-CM | POA: Diagnosis not present

## 2022-10-12 ENCOUNTER — Other Ambulatory Visit: Payer: Self-pay

## 2022-10-12 DIAGNOSIS — Z87891 Personal history of nicotine dependence: Secondary | ICD-10-CM

## 2022-10-12 DIAGNOSIS — Z122 Encounter for screening for malignant neoplasm of respiratory organs: Secondary | ICD-10-CM

## 2022-11-30 DIAGNOSIS — L738 Other specified follicular disorders: Secondary | ICD-10-CM | POA: Diagnosis not present

## 2022-12-20 DIAGNOSIS — Z1211 Encounter for screening for malignant neoplasm of colon: Secondary | ICD-10-CM | POA: Diagnosis not present

## 2022-12-20 DIAGNOSIS — K635 Polyp of colon: Secondary | ICD-10-CM | POA: Diagnosis not present

## 2022-12-20 DIAGNOSIS — K5730 Diverticulosis of large intestine without perforation or abscess without bleeding: Secondary | ICD-10-CM | POA: Diagnosis not present

## 2022-12-20 DIAGNOSIS — Z862 Personal history of diseases of the blood and blood-forming organs and certain disorders involving the immune mechanism: Secondary | ICD-10-CM | POA: Diagnosis not present

## 2023-01-24 ENCOUNTER — Ambulatory Visit: Payer: Medicare HMO

## 2023-01-24 DIAGNOSIS — K640 First degree hemorrhoids: Secondary | ICD-10-CM | POA: Diagnosis not present

## 2023-01-24 DIAGNOSIS — K5730 Diverticulosis of large intestine without perforation or abscess without bleeding: Secondary | ICD-10-CM | POA: Diagnosis not present

## 2023-01-24 DIAGNOSIS — Z1211 Encounter for screening for malignant neoplasm of colon: Secondary | ICD-10-CM | POA: Diagnosis not present

## 2023-02-07 DIAGNOSIS — E785 Hyperlipidemia, unspecified: Secondary | ICD-10-CM | POA: Diagnosis not present

## 2023-02-07 DIAGNOSIS — I10 Essential (primary) hypertension: Secondary | ICD-10-CM | POA: Diagnosis not present

## 2023-02-07 DIAGNOSIS — E1121 Type 2 diabetes mellitus with diabetic nephropathy: Secondary | ICD-10-CM | POA: Diagnosis not present

## 2023-02-07 DIAGNOSIS — D649 Anemia, unspecified: Secondary | ICD-10-CM | POA: Diagnosis not present

## 2023-02-07 DIAGNOSIS — E039 Hypothyroidism, unspecified: Secondary | ICD-10-CM | POA: Diagnosis not present

## 2023-02-14 DIAGNOSIS — E039 Hypothyroidism, unspecified: Secondary | ICD-10-CM | POA: Diagnosis not present

## 2023-02-14 DIAGNOSIS — Z87891 Personal history of nicotine dependence: Secondary | ICD-10-CM | POA: Diagnosis not present

## 2023-02-14 DIAGNOSIS — E1121 Type 2 diabetes mellitus with diabetic nephropathy: Secondary | ICD-10-CM | POA: Diagnosis not present

## 2023-02-14 DIAGNOSIS — I722 Aneurysm of renal artery: Secondary | ICD-10-CM | POA: Diagnosis not present

## 2023-02-14 DIAGNOSIS — I10 Essential (primary) hypertension: Secondary | ICD-10-CM | POA: Diagnosis not present

## 2023-02-14 DIAGNOSIS — I2510 Atherosclerotic heart disease of native coronary artery without angina pectoris: Secondary | ICD-10-CM | POA: Diagnosis not present

## 2023-02-14 DIAGNOSIS — E785 Hyperlipidemia, unspecified: Secondary | ICD-10-CM | POA: Diagnosis not present

## 2023-02-14 DIAGNOSIS — I700 Atherosclerosis of aorta: Secondary | ICD-10-CM | POA: Diagnosis not present

## 2023-04-19 DIAGNOSIS — H40003 Preglaucoma, unspecified, bilateral: Secondary | ICD-10-CM | POA: Diagnosis not present

## 2023-04-19 DIAGNOSIS — Z0100 Encounter for examination of eyes and vision without abnormal findings: Secondary | ICD-10-CM | POA: Diagnosis not present

## 2023-06-20 DIAGNOSIS — U071 COVID-19: Secondary | ICD-10-CM | POA: Diagnosis not present

## 2023-06-20 DIAGNOSIS — Z03818 Encounter for observation for suspected exposure to other biological agents ruled out: Secondary | ICD-10-CM | POA: Diagnosis not present

## 2023-08-09 DIAGNOSIS — R3129 Other microscopic hematuria: Secondary | ICD-10-CM | POA: Diagnosis not present

## 2023-08-09 DIAGNOSIS — R808 Other proteinuria: Secondary | ICD-10-CM | POA: Diagnosis not present

## 2023-08-09 DIAGNOSIS — E039 Hypothyroidism, unspecified: Secondary | ICD-10-CM | POA: Diagnosis not present

## 2023-08-09 DIAGNOSIS — I700 Atherosclerosis of aorta: Secondary | ICD-10-CM | POA: Diagnosis not present

## 2023-08-09 DIAGNOSIS — I722 Aneurysm of renal artery: Secondary | ICD-10-CM | POA: Diagnosis not present

## 2023-08-09 DIAGNOSIS — E1121 Type 2 diabetes mellitus with diabetic nephropathy: Secondary | ICD-10-CM | POA: Diagnosis not present

## 2023-08-09 DIAGNOSIS — E785 Hyperlipidemia, unspecified: Secondary | ICD-10-CM | POA: Diagnosis not present

## 2023-08-09 DIAGNOSIS — I10 Essential (primary) hypertension: Secondary | ICD-10-CM | POA: Diagnosis not present

## 2023-08-16 DIAGNOSIS — F17210 Nicotine dependence, cigarettes, uncomplicated: Secondary | ICD-10-CM | POA: Diagnosis not present

## 2023-08-16 DIAGNOSIS — E039 Hypothyroidism, unspecified: Secondary | ICD-10-CM | POA: Diagnosis not present

## 2023-08-16 DIAGNOSIS — Z8673 Personal history of transient ischemic attack (TIA), and cerebral infarction without residual deficits: Secondary | ICD-10-CM | POA: Diagnosis not present

## 2023-08-16 DIAGNOSIS — I10 Essential (primary) hypertension: Secondary | ICD-10-CM | POA: Diagnosis not present

## 2023-08-16 DIAGNOSIS — E1121 Type 2 diabetes mellitus with diabetic nephropathy: Secondary | ICD-10-CM | POA: Diagnosis not present

## 2023-08-16 DIAGNOSIS — Z1331 Encounter for screening for depression: Secondary | ICD-10-CM | POA: Diagnosis not present

## 2023-08-16 DIAGNOSIS — Z0000 Encounter for general adult medical examination without abnormal findings: Secondary | ICD-10-CM | POA: Diagnosis not present

## 2023-09-05 DIAGNOSIS — E039 Hypothyroidism, unspecified: Secondary | ICD-10-CM | POA: Diagnosis not present

## 2023-10-11 ENCOUNTER — Ambulatory Visit
Admission: RE | Admit: 2023-10-11 | Discharge: 2023-10-11 | Disposition: A | Discharge status: Home / Self Care | Source: Ambulatory Visit | Attending: Acute Care | Admitting: Acute Care

## 2023-10-11 DIAGNOSIS — Z87891 Personal history of nicotine dependence: Secondary | ICD-10-CM | POA: Insufficient documentation

## 2023-10-11 DIAGNOSIS — Z122 Encounter for screening for malignant neoplasm of respiratory organs: Secondary | ICD-10-CM | POA: Insufficient documentation

## 2023-10-24 ENCOUNTER — Other Ambulatory Visit: Payer: Self-pay

## 2023-10-24 DIAGNOSIS — Z87891 Personal history of nicotine dependence: Secondary | ICD-10-CM

## 2023-10-24 DIAGNOSIS — Z122 Encounter for screening for malignant neoplasm of respiratory organs: Secondary | ICD-10-CM

## 2023-10-31 DIAGNOSIS — Z03818 Encounter for observation for suspected exposure to other biological agents ruled out: Secondary | ICD-10-CM | POA: Diagnosis not present

## 2023-10-31 DIAGNOSIS — J019 Acute sinusitis, unspecified: Secondary | ICD-10-CM | POA: Diagnosis not present

## 2023-11-27 ENCOUNTER — Other Ambulatory Visit: Payer: Self-pay

## 2023-11-27 ENCOUNTER — Inpatient Hospital Stay
Admission: EM | Admit: 2023-11-27 | Discharge: 2023-11-30 | DRG: 872 | Disposition: A | Discharge status: Home / Self Care | Source: Ambulatory Visit | Attending: Student | Admitting: Student

## 2023-11-27 DIAGNOSIS — Z7984 Long term (current) use of oral hypoglycemic drugs: Secondary | ICD-10-CM

## 2023-11-27 DIAGNOSIS — I10 Essential (primary) hypertension: Secondary | ICD-10-CM | POA: Diagnosis present

## 2023-11-27 DIAGNOSIS — E876 Hypokalemia: Secondary | ICD-10-CM | POA: Diagnosis not present

## 2023-11-27 DIAGNOSIS — R6510 Systemic inflammatory response syndrome (SIRS) of non-infectious origin without acute organ dysfunction: Secondary | ICD-10-CM | POA: Diagnosis not present

## 2023-11-27 DIAGNOSIS — N390 Urinary tract infection, site not specified: Secondary | ICD-10-CM | POA: Diagnosis present

## 2023-11-27 DIAGNOSIS — Z79890 Hormone replacement therapy (postmenopausal): Secondary | ICD-10-CM

## 2023-11-27 DIAGNOSIS — R1030 Lower abdominal pain, unspecified: Secondary | ICD-10-CM | POA: Diagnosis not present

## 2023-11-27 DIAGNOSIS — N3000 Acute cystitis without hematuria: Secondary | ICD-10-CM | POA: Diagnosis not present

## 2023-11-27 DIAGNOSIS — A419 Sepsis, unspecified organism: Secondary | ICD-10-CM | POA: Diagnosis not present

## 2023-11-27 DIAGNOSIS — Z87891 Personal history of nicotine dependence: Secondary | ICD-10-CM | POA: Diagnosis not present

## 2023-11-27 DIAGNOSIS — E785 Hyperlipidemia, unspecified: Secondary | ICD-10-CM | POA: Diagnosis present

## 2023-11-27 DIAGNOSIS — A4181 Sepsis due to Enterococcus: Principal | ICD-10-CM | POA: Diagnosis present

## 2023-11-27 DIAGNOSIS — Z79899 Other long term (current) drug therapy: Secondary | ICD-10-CM | POA: Diagnosis not present

## 2023-11-27 DIAGNOSIS — E039 Hypothyroidism, unspecified: Secondary | ICD-10-CM | POA: Diagnosis present

## 2023-11-27 DIAGNOSIS — Z8673 Personal history of transient ischemic attack (TIA), and cerebral infarction without residual deficits: Secondary | ICD-10-CM | POA: Diagnosis not present

## 2023-11-27 DIAGNOSIS — Z1152 Encounter for screening for COVID-19: Secondary | ICD-10-CM

## 2023-11-27 DIAGNOSIS — R140 Abdominal distension (gaseous): Secondary | ICD-10-CM | POA: Diagnosis not present

## 2023-11-27 DIAGNOSIS — R399 Unspecified symptoms and signs involving the genitourinary system: Secondary | ICD-10-CM | POA: Diagnosis not present

## 2023-11-27 DIAGNOSIS — Z8249 Family history of ischemic heart disease and other diseases of the circulatory system: Secondary | ICD-10-CM | POA: Diagnosis not present

## 2023-11-27 DIAGNOSIS — E1121 Type 2 diabetes mellitus with diabetic nephropathy: Secondary | ICD-10-CM | POA: Diagnosis present

## 2023-11-27 DIAGNOSIS — Z7982 Long term (current) use of aspirin: Secondary | ICD-10-CM | POA: Diagnosis not present

## 2023-11-27 DIAGNOSIS — Z823 Family history of stroke: Secondary | ICD-10-CM

## 2023-11-27 DIAGNOSIS — R300 Dysuria: Secondary | ICD-10-CM | POA: Diagnosis not present

## 2023-11-27 DIAGNOSIS — E871 Hypo-osmolality and hyponatremia: Secondary | ICD-10-CM | POA: Diagnosis present

## 2023-11-27 DIAGNOSIS — B952 Enterococcus as the cause of diseases classified elsewhere: Secondary | ICD-10-CM | POA: Diagnosis not present

## 2023-11-27 DIAGNOSIS — R509 Fever, unspecified: Secondary | ICD-10-CM | POA: Diagnosis not present

## 2023-11-27 LAB — COMPREHENSIVE METABOLIC PANEL WITH GFR
ALT: 12 U/L (ref 0–44)
AST: 15 U/L (ref 15–41)
Albumin: 4.1 g/dL (ref 3.5–5.0)
Alkaline Phosphatase: 66 U/L (ref 38–126)
Anion gap: 11 (ref 5–15)
BUN: 13 mg/dL (ref 8–23)
CO2: 27 mmol/L (ref 22–32)
Calcium: 9.2 mg/dL (ref 8.9–10.3)
Chloride: 96 mmol/L — ABNORMAL LOW (ref 98–111)
Creatinine, Ser: 0.99 mg/dL (ref 0.61–1.24)
GFR, Estimated: >60 mL/min (ref >60)
Glucose, Bld: 144 mg/dL — ABNORMAL HIGH (ref 70–99)
Potassium: 3.5 mmol/L (ref 3.5–5.1)
Sodium: 134 mmol/L — ABNORMAL LOW (ref 135–145)
Total Bilirubin: 1.1 mg/dL (ref 0.0–1.2)
Total Protein: 7.7 g/dL (ref 6.5–8.1)

## 2023-11-27 LAB — CBC
HCT: 42.5 % (ref 39.0–52.0)
HCT: 40.9 % (ref 39.0–52.0)
Hemoglobin: 14.4 g/dL (ref 13.0–17.0)
Hemoglobin: 14 g/dL (ref 13.0–17.0)
MCH: 31.4 pg (ref 26.0–34.0)
MCH: 31.5 pg (ref 26.0–34.0)
MCHC: 33.9 g/dL (ref 30.0–36.0)
MCHC: 34.2 g/dL (ref 30.0–36.0)
MCV: 92.8 fL (ref 80.0–100.0)
MCV: 92.1 fL (ref 80.0–100.0)
Platelets: 233 K/uL (ref 150–400)
Platelets: 238 K/uL (ref 150–400)
RBC: 4.58 MIL/uL (ref 4.22–5.81)
RBC: 4.44 MIL/uL (ref 4.22–5.81)
RDW: 12 % (ref 11.5–15.5)
RDW: 12.1 % (ref 11.5–15.5)
WBC: 18.2 K/uL — ABNORMAL HIGH (ref 4.0–10.5)
WBC: 19.8 K/uL — ABNORMAL HIGH (ref 4.0–10.5)
nRBC: 0 % (ref 0.0–0.2)
nRBC: 0 % (ref 0.0–0.2)

## 2023-11-27 LAB — URINALYSIS, W/ REFLEX TO CULTURE (INFECTION SUSPECTED)
Bilirubin Urine: NEGATIVE
Glucose, UA: NEGATIVE mg/dL
Ketones, ur: NEGATIVE mg/dL
Nitrite: NEGATIVE
Protein, ur: 100 mg/dL — AB
Specific Gravity, Urine: 1.014 (ref 1.005–1.030)
WBC, UA: >50 WBC/hpf (ref 0–5)
pH: 7 (ref 5.0–8.0)

## 2023-11-27 LAB — HEMOGLOBIN A1C
Hgb A1c MFr Bld: 5.7 % — ABNORMAL HIGH (ref 4.8–5.6)
Mean Plasma Glucose: 116.89 mg/dL

## 2023-11-27 LAB — CBG MONITORING, ED
Glucose-Capillary: 153 mg/dL — ABNORMAL HIGH (ref 70–99)
Glucose-Capillary: 176 mg/dL — ABNORMAL HIGH (ref 70–99)

## 2023-11-27 LAB — RESP PANEL BY RT-PCR (RSV, FLU A&B, COVID)  RVPGX2
Influenza A by PCR: NEGATIVE
Influenza B by PCR: NEGATIVE
Resp Syncytial Virus by PCR: NEGATIVE
SARS Coronavirus 2 by RT PCR: NEGATIVE

## 2023-11-27 LAB — LIPASE, BLOOD: Lipase: 28 U/L (ref 11–51)

## 2023-11-27 LAB — CREATININE, SERUM
Creatinine, Ser: 0.88 mg/dL (ref 0.61–1.24)
GFR, Estimated: >60 mL/min (ref >60)

## 2023-11-27 LAB — LACTIC ACID, PLASMA: Lactic Acid, Venous: 1.3 mmol/L (ref 0.5–1.9)

## 2023-11-27 MED ORDER — PRAVASTATIN SODIUM 20 MG PO TABS
40.0000 mg | ORAL_TABLET | Freq: Every day | ORAL | Status: DC
Start: 2023-11-27 — End: 2023-11-30
  Administered 2023-11-28 – 2023-11-30 (×3): 40 mg via ORAL
  Filled 2023-11-27 (×4): qty 2

## 2023-11-27 MED ORDER — SODIUM CHLORIDE 0.9 % IV SOLN
2.0000 g | INTRAVENOUS | Status: DC
  Administered 2023-11-28 – 2023-11-30 (×3): 2 g via INTRAVENOUS
  Filled 2023-11-27 (×3): qty 20

## 2023-11-27 MED ORDER — ASPIRIN 81 MG PO CHEW
81.0000 mg | CHEWABLE_TABLET | Freq: Every day | ORAL | Status: DC
  Administered 2023-11-28 – 2023-11-30 (×3): 81 mg via ORAL
  Filled 2023-11-27 (×4): qty 1

## 2023-11-27 MED ORDER — ACETAMINOPHEN 325 MG PO TABS
650.0000 mg | ORAL_TABLET | Freq: Four times a day (QID) | ORAL | Status: DC | PRN
  Administered 2023-11-27: 650 mg via ORAL
  Filled 2023-11-27: qty 2

## 2023-11-27 MED ORDER — ONDANSETRON HCL 4 MG/2ML IJ SOLN: 4.0000 mg | Freq: Four times a day (QID) | INTRAMUSCULAR | Status: DC | PRN

## 2023-11-27 MED ORDER — SODIUM CHLORIDE 0.9 % IV BOLUS (SEPSIS)
1000.0000 mL | Freq: Once | INTRAVENOUS | Status: AC
  Administered 2023-11-27: 1000 mL via INTRAVENOUS

## 2023-11-27 MED ORDER — POLYETHYLENE GLYCOL 3350 17 G PO PACK
17.0000 g | PACK | Freq: Every day | ORAL | Status: DC | PRN
  Administered 2023-11-29: 17 g via ORAL
  Filled 2023-11-27: qty 1

## 2023-11-27 MED ORDER — ONDANSETRON HCL 4 MG PO TABS: 4.0000 mg | ORAL_TABLET | Freq: Four times a day (QID) | ORAL | Status: DC | PRN

## 2023-11-27 MED ORDER — TAMSULOSIN HCL 0.4 MG PO CAPS
0.4000 mg | ORAL_CAPSULE | Freq: Every day | ORAL | Status: DC
  Administered 2023-11-28 – 2023-11-30 (×3): 0.4 mg via ORAL
  Filled 2023-11-27 (×4): qty 1

## 2023-11-27 MED ORDER — INSULIN ASPART 100 UNIT/ML IJ SOLN
0.0000 [IU] | Freq: Three times a day (TID) | INTRAMUSCULAR | Status: DC
  Administered 2023-11-28: 1 [IU] via SUBCUTANEOUS
  Administered 2023-11-28 (×2): 2 [IU] via SUBCUTANEOUS
  Administered 2023-11-29 (×2): 1 [IU] via SUBCUTANEOUS
  Administered 2023-11-29: 2 [IU] via SUBCUTANEOUS
  Filled 2023-11-27 (×6): qty 1

## 2023-11-27 MED ORDER — SODIUM CHLORIDE 0.9 % IV SOLN
2.0000 g | Freq: Once | INTRAVENOUS | Status: AC
  Administered 2023-11-27: 2 g via INTRAVENOUS
  Filled 2023-11-27: qty 20

## 2023-11-27 MED ORDER — ACETAMINOPHEN 650 MG RE SUPP: 650.0000 mg | Freq: Four times a day (QID) | RECTAL | Status: DC | PRN

## 2023-11-27 MED ORDER — LISINOPRIL 20 MG PO TABS
40.0000 mg | ORAL_TABLET | Freq: Every day | ORAL | Status: DC
  Administered 2023-11-28 – 2023-11-30 (×3): 40 mg via ORAL
  Filled 2023-11-27: qty 2
  Filled 2023-11-27: qty 4
  Filled 2023-11-27 (×2): qty 2

## 2023-11-27 MED ORDER — HYDROCHLOROTHIAZIDE 25 MG PO TABS
25.0000 mg | ORAL_TABLET | Freq: Every day | ORAL | Status: DC
Start: 2023-11-27 — End: 2023-11-30
  Administered 2023-11-28 – 2023-11-30 (×3): 25 mg via ORAL
  Filled 2023-11-27 (×4): qty 1

## 2023-11-27 MED ORDER — AMLODIPINE BESYLATE 10 MG PO TABS
10.0000 mg | ORAL_TABLET | Freq: Every day | ORAL | Status: DC
Start: 2023-11-27 — End: 2023-11-30
  Administered 2023-11-28 – 2023-11-30 (×3): 10 mg via ORAL
  Filled 2023-11-27: qty 1
  Filled 2023-11-27: qty 2
  Filled 2023-11-27 (×2): qty 1

## 2023-11-27 MED ORDER — LEVOTHYROXINE SODIUM 25 MCG PO TABS
25.0000 ug | ORAL_TABLET | Freq: Every day | ORAL | Status: DC
  Administered 2023-11-28 – 2023-11-30 (×3): 25 ug via ORAL
  Filled 2023-11-27 (×3): qty 1

## 2023-11-27 MED ORDER — INSULIN ASPART 100 UNIT/ML IJ SOLN
0.0000 [IU] | Freq: Every day | INTRAMUSCULAR | Status: DC
  Administered 2023-11-28: 2 [IU] via SUBCUTANEOUS
  Filled 2023-11-27: qty 1

## 2023-11-27 MED ORDER — ENOXAPARIN SODIUM 40 MG/0.4ML IJ SOSY
40.0000 mg | PREFILLED_SYRINGE | Freq: Every day | INTRAMUSCULAR | Status: DC
  Administered 2023-11-27 – 2023-11-29 (×3): 40 mg via SUBCUTANEOUS
  Filled 2023-11-27 (×3): qty 0.4

## 2023-11-27 NOTE — H&P (Signed)
 History and Physical    [facility] Phillip Kent. FMW:[id number] DOB: 04-23-1950 DOA: 11/27/2023  DOS: the patient was seen and examined on 11/27/2023  PCP: Phillip Ophelia JINNY DOUGLAS, MD   Patient coming from: Home  I have personally briefly reviewed patient's old medical records in [facility]  Chief Complaint: Burning urination  HPI: Phillip Kent. is a pleasant 73 y.o. male with medical history significant for history of prior stroke without any residual, HTN, HLD, DM who came into ED for dysuria and abnormal lab work at [place] [facility].  Patient states that for the last few days he has been experiencing trouble urinating as well as burning when he does urinate.  Last night he felt some chills took his temperature and it was 104 F.  Patient went to see his doctor today and where his urine was checked which showed high blood cell count as well as fast heart rate and was referred to emergency room for evaluation.  Patient denied any nausea vomiting, abdominal pain, chest pain, shortness of breath, palpitations.  ED Course: Upon arrival to the ED, patient is found to be tachycardic around 113, temperature 99 F, blood pressure 117/91, urine analysis was positive for UTI, leukocytosis at 18,000, no lactic acidosis and patient also had urinary retention.  Patient was given antibiotic, sent to cultures.  Hospitalist service was consulted for evaluation for admission.  Review of Systems:  ROS  All other systems negative except as noted in the HPI.  Past Medical History:  Diagnosis Date   Hypertension    Personal history of tobacco use, presenting hazards to health 07/17/2015   Stroke (HCC)    10-15 yrs ago. Occasional hand/foot numbness   Tobacco abuse 07/23/2014   Type 2 diabetes mellitus (HCC)    Wears dentures    partiel upper, full lower    Past Surgical History:  Procedure Laterality Date   CATARACT EXTRACTION W/PHACO Left 06/22/2020   Procedure: CATARACT EXTRACTION PHACO AND  INTRAOCULAR LENS PLACEMENT (IOC) LEFT DIABETIC 4.81 00:35.4;  Surgeon: Myrna Adine Anes, MD;  Location: [facility];  Service: Ophthalmology;  Laterality: Left;  Diabetic - oral meds   CATARACT EXTRACTION W/PHACO Right 08/03/2020   Procedure: CATARACT EXTRACTION PHACO AND INTRAOCULAR LENS PLACEMENT (IOC) RIGHT DIABETIC 3.61 00:28.8;  Surgeon: Myrna Adine Anes, MD;  Location: [facility];  Service: Ophthalmology;  Laterality: Right;   FRACTURE SURGERY     leg     reports that he quit smoking about 4 years ago. His smoking use included cigarettes. He started smoking about 34 years ago. He has a 30 pack-year smoking history. He has never used smokeless tobacco. He reports that he does not currently use alcohol . No history on file for drug use.  No Known Allergies  Family History  Problem Relation Age of Onset   Congestive Heart Failure Mother    Heart attack Paternal Uncle    Stroke Paternal Grandmother     Prior to Admission medications   Medication Sig Start Date End Date Taking? Authorizing Provider  Alcohol  Swabs (DROPSAFE ALCOHOL  PREP) 70 % PADS Apply topically. 08/11/21   [provider]  amLODipine  (NORVASC ) 10 MG tablet Take 10 mg by mouth daily.    [provider]  ASPIRIN  81 PO Take by mouth daily.    [provider]  hydrochlorothiazide  (HYDRODIURIL ) 25 MG tablet Take 25 mg by mouth daily.    [provider]  levothyroxine  (SYNTHROID ) 25 MCG tablet Take 25  mcg by mouth daily. 08/03/21   [provider]  lisinopril  (ZESTRIL ) 40 MG tablet Take 40 mg by mouth daily.    [provider]  metFORMIN (GLUCOPHAGE) 1000 MG tablet Take 1,000 mg by mouth 2 (two) times daily with a meal.    [provider]  Omega-3 Fatty Acids (FISH OIL) 1000 MG CAPS Take by mouth in the morning and at bedtime.    [provider]  pravastatin  (PRAVACHOL ) 40 MG tablet Take 40 mg by mouth daily.    [provider]     Physical Exam: Vitals:   11/27/23 1200 11/27/23 1230 11/27/23 1300 11/27/23 1400  BP: (!) 138/90 (!) 150/95 (!) 155/90 (!) 148/89  Pulse: (!) 113 97 (!) 105 (!) 102  Resp: (!) 24 19 19 19   Temp:      SpO2: 100% 99% 98% 99%  Weight:      Height:        Physical Exam   Constitutional: Alert, awake, calm, comfortable HEENT: Neck supple Respiratory: Clear to auscultation B/L, no wheezing, no rales.  Cardiovascular: Regular rate and rhythm, no murmurs / rubs / gallops. No extremity edema. 2+ pedal pulses. No carotid bruits.  Abdomen: Soft, no tenderness, Bowel sounds positive.  Obese abdomen Musculoskeletal: no clubbing / cyanosis. Good ROM, no contractures. Normal muscle tone.  Skin: no rashes, lesions, ulcers. Neurologic: CN 2-12 grossly intact. Sensation intact, No focal deficit identified Psychiatric: Alert and oriented x 3. Normal mood.    Labs on Admission: I have personally reviewed following labs and imaging studies  CBC: Recent Labs  Lab 11/27/23 1054  WBC 18.2*  HGB 14.4  HCT 42.5  MCV 92.8  PLT 233   Basic Metabolic Panel: Recent Labs  Lab 11/27/23 1054  NA 134*  K 3.5  CL 96*  CO2 27  GLUCOSE 144*  BUN 13  CREATININE 0.99  CALCIUM 9.2   GFR: Estimated Creatinine Clearance: 69.9 mL/min (by C-G formula based on SCr of 0.99 mg/dL). Liver Function Tests: Recent Labs  Lab 11/27/23 1054  AST 15  ALT 12  ALKPHOS 66  BILITOT 1.1  PROT 7.7  ALBUMIN 4.1   Recent Labs  Lab 11/27/23 1054  LIPASE 28   No results for input(s): AMMONIA in the last 168 hours. Coagulation Profile: No results for input(s): INR, PROTIME in the last 168 hours. Cardiac Enzymes: No results for input(s): CKTOTAL, CKMB, CKMBINDEX, TROPONINI, TROPONINIHS in the last 168 hours. BNP (last 3 results) No results for input(s): BNP in the last 8760 hours. HbA1C: No results for input(s): HGBA1C in the last 72 hours. CBG: No results for input(s): GLUCAP  in the last 168 hours. Lipid Profile: No results for input(s): CHOL, HDL, LDLCALC, TRIG, CHOLHDL, LDLDIRECT in the last 72 hours. Thyroid  Function Tests: No results for input(s): TSH, T4TOTAL, FREET4, T3FREE, THYROIDAB in the last 72 hours. Anemia Panel: No results for input(s): VITAMINB12, FOLATE, FERRITIN, TIBC, IRON, RETICCTPCT in the last 72 hours. Urine analysis:    Component Value Date/Time   COLORURINE YELLOW (A) 11/27/2023 1211   APPEARANCEUR HAZY (A) 11/27/2023 1211   APPEARANCEUR Clear 08/31/2021 1406   LABSPEC 1.014 11/27/2023 1211   PHURINE 7.0 11/27/2023 1211   GLUCOSEU NEGATIVE 11/27/2023 1211   HGBUR SMALL (A) 11/27/2023 1211   BILIRUBINUR NEGATIVE 11/27/2023 1211   BILIRUBINUR Negative 08/31/2021 1406   KETONESUR NEGATIVE 11/27/2023 1211   PROTEINUR 100 (A) 11/27/2023 1211   UROBILINOGEN 0.2 08/14/2009 1508   NITRITE NEGATIVE 11/27/2023  1211   LEUKOCYTESUR SMALL (A) 11/27/2023 1211    Radiological Exams on Admission: I have personally reviewed images No results found.  EKG: My personal interpretation of EKG shows: Sinus rhythm with left anterior fascicular block    Assessment/Plan Principal Problem:   UTI (urinary tract infection) Active Problems:   Type 2 diabetes mellitus with diabetic nephropathy (HCC)   Hypertension   History of stroke   HLD (hyperlipidemia)    Assessment and Plan: 73 year old male W/PMH of HTN, HLD, DM, history of previous stroke who came into ED at [facility] complaining of dysuria and fever.  1.  Sepsis secondary to urinary tract infection present on admission - Patient meets criteria for sepsis due to tachycardia, hypotension, leukocytosis, urinary tract infection - He was given antibiotic in the emergency room ceftriaxone . - I will continue ceftriaxone . - Patient also has some urinary retention. - I will place him on Flomax .  If retention persists then we may need to put the Foley catheter and  possibly urology evaluation as well. - Follow-up cultures.  2.  Type 2 diabetes - Patient appears to be taking metformin. - Will place him on insulin  sliding scale - Continue diabetic diet  3.  HTN/HLD - Resume home medications - Continue to monitor blood pressure  4.  Hypothyroidism - Continue levothyroxine  25 mcg daily  5.  History of stroke - Continue supportive care    DVT prophylaxis: Lovenox  Code Status: Full Code Family Communication: None  Disposition Plan: Home  Consults called: None  Admission status: Inpatient, Telemetry bed   Nena Rebel, MD [facility] 11/27/2023, 2:41 PM

## 2023-11-27 NOTE — ED Notes (Signed)
 This ED helped pt ambulate to in room toilet.

## 2023-11-27 NOTE — Sepsis Progress Note (Signed)
 Cod Sepsis protocol being monitored by eLink.

## 2023-11-27 NOTE — ED Provider Notes (Signed)
 [facility] Provider Note    Event Date/Time   First MD Initiated Contact with Patient 11/27/23 1111     (approximate)  History   Chief Complaint: UTI  HPI  Khambrel Amsden. is a 73 y.o. male with a past medical history of hypertension, prior CVA, diabetes, presents to the emergency department for dysuria and abnormal lab work.  According to the patient for the past few days he has been experiencing trouble urinating as well as burning when he does urinate.  Patient states last night he felt chills took his temperature and it was 104 orally per patient.  Patient went to his doctor today had lab work showing a high white blood cell count as well as high heart rate and was referred to the emergency department for further evaluation.  Patient denies any nausea vomiting or diarrhea.  Physical Exam   Triage Vital Signs: ED Triage Vitals  Encounter Vitals Group     BP 11/27/23 1051 (!) 117/91     Girls Systolic BP Percentile --      Girls Diastolic BP Percentile --      Boys Systolic BP Percentile --      Boys Diastolic BP Percentile --      Pulse Rate 11/27/23 1051 (!) 113     Resp 11/27/23 1051 19     Temp 11/27/23 1051 99 F (37.2 C)     Temp src --      SpO2 11/27/23 1051 96 %     Weight 11/27/23 1052 190 lb (86.2 kg)     Height 11/27/23 1052 5' 8 (1.727 m)     Head Circumference --      Peak Flow --      Pain Score 11/27/23 1051 5     Pain Loc --      Pain Education --      Exclude from Growth Chart --     Most recent vital signs: Vitals:   11/27/23 1051  BP: (!) 117/91  Pulse: (!) 113  Resp: 19  Temp: 99 F (37.2 C)  SpO2: 96%    General: Awake, no distress.  CV:  Good peripheral perfusion.  Regular rate and rhythm  Resp:  Normal effort.  Equal breath sounds bilaterally.  Abd:  No distention.  Soft, nontender.  No rebound or guarding.  ED Results / Procedures / Treatments   MEDICATIONS ORDERED IN ED: Medications  sodium  chloride 0.9 % bolus 1,000 mL (has no administration in time range)  cefTRIAXone  (ROCEPHIN ) 2 g in sodium chloride  0.9 % 100 mL IVPB (has no administration in time range)     IMPRESSION / MDM / ASSESSMENT AND PLAN / ED COURSE  I reviewed the triage vital signs and the nursing notes.  Patient's presentation is most consistent with acute presentation with potential threat to life or bodily function.  Patient presents to the emergency department for dysuria was sent from [facility] after had a urinary tract infection seen on urinalysis as well as elevated white blood cell count.  Reports a fever to 104 last night patient is tachycardic to 113 with a low-grade temperature 99 degrees in the emergency department.  Concerns for sepsis.  We will recheck labs, blood cultures and lactic acid.  Will start the patient on IV Rocephin  as I was able to review his urinalysis.  Will attempt to obtain a new urine sample for urine culture.  Given the fever leukocytosis tachycardia and UTI  patient will likely require admission to the hospital for further workup and treatment.  Will obtain a postvoid bladder scan as well.  Patient's lab work shows continued leukocytosis 18,000, chemistry shows no significant finding, lactic acid 1.3, respiratory panel negative, urinalysis concerning for urinary tract infection.  Given the patient's reported fever tachycardia leukocytosis meets sepsis criteria.  Patient started on broad-spectrum antibiotics.  Will admit to the hospital service for further workup and treatment.  Patient agreeable to plan of care.   CRITICAL CARE Performed by: Franky Moores   Total critical care time: 30 minutes  Critical care time was exclusive of separately billable procedures and treating other patients.  Critical care was necessary to treat or prevent imminent or life-threatening deterioration.  Critical care was time spent personally by me on the following activities: development of  treatment plan with patient and/or surrogate as well as nursing, discussions with consultants, evaluation of patient's response to treatment, examination of patient, obtaining history from patient or surrogate, ordering and performing treatments and interventions, ordering and review of laboratory studies, ordering and review of radiographic studies, pulse oximetry and re-evaluation of patient's condition.   FINAL CLINICAL IMPRESSION(S) / ED DIAGNOSES   Urinary tract infection Sepsis   Note:  This document was prepared using Dragon voice recognition software and may include unintentional dictation errors.   Moores Franky, MD 11/27/23 1313

## 2023-11-27 NOTE — ED Triage Notes (Addendum)
 Pt comes with c/o UTI symptoms for week. Pt sent form [facility] with concerns for sepsis. Pt had some bloodwork done prior. Pt states left lower pain. Pt did have UA done also and resulted.

## 2023-11-27 NOTE — ED Triage Notes (Signed)
 Arrives from [facility] for ED evaluation. UTI symptoms x 1 week.  Referred for concerns of sepsis.  Patient ambulates into ED with easy and steady gait.  Skin warm and dry. NAD.  VS wnl at [facility].

## 2023-11-27 NOTE — ED Notes (Signed)
 Initial bladder scan showed 740. Pt urinated and bladder scanned showed 368. Paduchowski, MD aware

## 2023-11-27 NOTE — Consult Note (Signed)
 CODE SEPSIS - PHARMACY COMMUNICATION  **Broad Spectrum Antibiotics should be administered within 1 hour of Sepsis diagnosis**  Time Code Sepsis Called/Page Received: 1122  Antibiotics Ordered: ceftriaxone   Time of 1st antibiotic administration: 1152  Additional action taken by pharmacy: None    Cathaleen GORMAN Blanch ,PharmD Clinical Pharmacist  11/27/2023  12:02 PM

## 2023-11-28 DIAGNOSIS — N3000 Acute cystitis without hematuria: Secondary | ICD-10-CM | POA: Diagnosis not present

## 2023-11-28 LAB — CBC
HCT: 37.9 % — ABNORMAL LOW (ref 39.0–52.0)
Hemoglobin: 13.1 g/dL (ref 13.0–17.0)
MCH: 31.5 pg (ref 26.0–34.0)
MCHC: 34.6 g/dL (ref 30.0–36.0)
MCV: 91.1 fL (ref 80.0–100.0)
Platelets: 206 K/uL (ref 150–400)
RBC: 4.16 MIL/uL — ABNORMAL LOW (ref 4.22–5.81)
RDW: 12.2 % (ref 11.5–15.5)
WBC: 13.2 K/uL — ABNORMAL HIGH (ref 4.0–10.5)
nRBC: 0 % (ref 0.0–0.2)

## 2023-11-28 LAB — COMPREHENSIVE METABOLIC PANEL WITH GFR
ALT: 12 U/L (ref 0–44)
AST: 15 U/L (ref 15–41)
Albumin: 3.3 g/dL — ABNORMAL LOW (ref 3.5–5.0)
Alkaline Phosphatase: 55 U/L (ref 38–126)
Anion gap: 8 (ref 5–15)
BUN: 12 mg/dL (ref 8–23)
CO2: 27 mmol/L (ref 22–32)
Calcium: 8.5 mg/dL — ABNORMAL LOW (ref 8.9–10.3)
Chloride: 97 mmol/L — ABNORMAL LOW (ref 98–111)
Creatinine, Ser: 0.91 mg/dL (ref 0.61–1.24)
GFR, Estimated: >60 mL/min (ref >60)
Glucose, Bld: 130 mg/dL — ABNORMAL HIGH (ref 70–99)
Potassium: 3.3 mmol/L — ABNORMAL LOW (ref 3.5–5.1)
Sodium: 132 mmol/L — ABNORMAL LOW (ref 135–145)
Total Bilirubin: 0.9 mg/dL (ref 0.0–1.2)
Total Protein: 6.6 g/dL (ref 6.5–8.1)

## 2023-11-28 LAB — GLUCOSE, CAPILLARY
Glucose-Capillary: 127 mg/dL — ABNORMAL HIGH (ref 70–99)
Glucose-Capillary: 153 mg/dL — ABNORMAL HIGH (ref 70–99)
Glucose-Capillary: 162 mg/dL — ABNORMAL HIGH (ref 70–99)
Glucose-Capillary: 233 mg/dL — ABNORMAL HIGH (ref 70–99)

## 2023-11-28 LAB — PROTIME-INR
INR: 1.2 (ref 0.8–1.2)
Prothrombin Time: 16 s — ABNORMAL HIGH (ref 11.4–15.2)

## 2023-11-28 MED ORDER — POTASSIUM CHLORIDE CRYS ER 20 MEQ PO TBCR
40.0000 meq | EXTENDED_RELEASE_TABLET | Freq: Once | ORAL | Status: AC
  Administered 2023-11-28: 40 meq via ORAL
  Filled 2023-11-28: qty 2

## 2023-11-28 NOTE — Plan of Care (Signed)
  Problem: Fluid Volume: Goal: Ability to maintain a balanced intake and output will improve Outcome: Progressing   Problem: Health Behavior/Discharge Planning: Goal: Ability to manage health-related needs will improve Outcome: Progressing   Problem: Metabolic: Goal: Ability to maintain appropriate glucose levels will improve Outcome: Progressing   Problem: Nutritional: Goal: Maintenance of adequate nutrition will improve Outcome: Progressing   Problem: Skin Integrity: Goal: Risk for impaired skin integrity will decrease Outcome: Progressing   Problem: Nutrition: Goal: Adequate nutrition will be maintained Outcome: Progressing   Problem: Safety: Goal: Ability to remain free from injury will improve Outcome: Progressing

## 2023-11-28 NOTE — Plan of Care (Signed)
  Problem: Coping: Goal: Ability to adjust to condition or change in health will improve Outcome: Progressing   Problem: Fluid Volume: Goal: Ability to maintain a balanced intake and output will improve Outcome: Progressing   Problem: Health Behavior/Discharge Planning: Goal: Ability to manage health-related needs will improve Outcome: Progressing   Problem: Metabolic: Goal: Ability to maintain appropriate glucose levels will improve Outcome: Progressing   Problem: Nutritional: Goal: Maintenance of adequate nutrition will improve Outcome: Progressing   Problem: Skin Integrity: Goal: Risk for impaired skin integrity will decrease Outcome: Progressing   Problem: Nutrition: Goal: Adequate nutrition will be maintained Outcome: Progressing

## 2023-11-28 NOTE — Hospital Course (Addendum)
 Phillip Ang Griffen Frayne. is a 73 y.o. male with PMH of prior stroke without any residual, HTN, HLD, DM who came into ED for dysuria and abnormal lab work at [facility] that was drawn due to patient's trouble urinating and chills/fever. Lab with leukocytosis tachycardia at doctor's office and sent to the ED ED Course: Upon arrival to the ED, patient is found to be tachycardic around 113, temperature 99 F, blood pressure 117/91, urine analysis was positive for UTI, leukocytosis at 18,000, no lactic acidosis and patient also had urinary retention.  Patient was given antibiotic, sent to cultures.  Hospitalist service was consulted for evaluation for admission  Subjective: Seen and examined today Overnight fever up to 101 last night, BP stable on room air Labs reviewed with hyponatremia hypokalemia WBC down  18>19>13 He has no new complaints, resting comfortably Voiding is less painful does have chronic issue with some retention at baseline  Assessment and plan:  Sepsis secondary to  UTI POA: Vitals remained stable, WBC downtrending, continue ceftriaxone  2 g follow-up blood and urine cultures. Continue Flomax >If retention persists then we may need to put the Foley catheter and possibly urology eval  Mild hyponatremia: Encourage p.o.  Type 2 diabetes A1c 5.7, continue sliding scale diabetic diet and hold metformin.  HTN HLD: BP remains stable, continue her amlodipine  10, HCTZ 25, lisinopril  and pravastatin   Hypothyroidism Continue levothyroxine  25 mcg daily   History of stroke Continue her aspirin  81 and statin  Hypokalemia: Replaced po  DVT prophylaxis: enoxaparin  (LOVENOX ) injection 40 mg Start: 11/27/23 2200 SCDs Start: 11/27/23 1414 Code Status:   Code Status: Full Code Family Communication: plan of care discussed with patien at bedside. Patient status is: Remains hospitalized because of severity of illness Level of care: Telemetry Medical   Dispo: The patient is from:  home            Anticipated disposition: Anticipate discharge in next 24 hours Objective: Vitals last 24 hrs: Vitals:   11/27/23 2238 11/27/23 2318 11/28/23 0359 11/28/23 0731  BP: 116/77 118/69 106/64 123/66  Pulse: (!) 104 88 73 79  Resp: (!) 28 (!) 24 (!) 22 18  Temp: 98.2 F (36.8 C)  98.9 F (37.2 C) 99.3 F (37.4 C)  TempSrc: Oral  Oral Oral  SpO2: 96% 97% 98% 97%  Weight:  88.7 kg    Height:  5' 8 (1.727 m)      Physical Examination: General exam: alert awake, oriented, older than stated age HEENT:Oral mucosa moist, Ear/Nose WNL grossly Respiratory system: Bilaterally clear BS,no use of accessory muscle Cardiovascular system: S1 & S2 +, No JVD. Gastrointestinal system: Abdomen soft,NT,ND, BS+ Nervous System: Alert, awake, moving all extremities,and following commands. Extremities: extremities warm, leg edema neg Skin: No rashes,no icterus. MSK: Normal muscle bulk,tone, power   Medications reviewed:  Scheduled Meds:  amLODipine   10 mg Oral Daily   aspirin   81 mg Oral Daily   enoxaparin  (LOVENOX ) injection  40 mg Subcutaneous QHS   hydrochlorothiazide   25 mg Oral Daily   insulin  aspart  0-5 Units Subcutaneous QHS   insulin  aspart  0-9 Units Subcutaneous TID WC   levothyroxine   25 mcg Oral Daily   lisinopril   40 mg Oral Daily   pravastatin   40 mg Oral Daily   tamsulosin   0.4 mg Oral Daily   Continuous Infusions:  cefTRIAXone  (ROCEPHIN )  IV     Diet: Diet Order             Diet regular  Room service appropriate? Yes; Fluid consistency: Thin  Diet effective now                     Need follow up for

## 2023-11-28 NOTE — Progress Notes (Signed)
 PROGRESS NOTE Phillip Kent Lael Mickey.  FMW:[id number] DOB: May 05, 1950 DOA: 11/27/2023 PCP: Fernande Ophelia JINNY DOUGLAS, MD  Brief Narrative/Hospital Course: Phillip Kent Jahquan Klugh. is a 73 y.o. male with PMH of prior stroke without any residual, HTN, HLD, DM who came into ED for dysuria and abnormal lab work at [facility] that was drawn due to patient's trouble urinating and chills/fever. Lab with leukocytosis tachycardia at doctor's office and sent to the ED ED Course: Upon arrival to the ED, patient is found to be tachycardic around 113, temperature 99 F, blood pressure 117/91, urine analysis was positive for UTI, leukocytosis at 18,000, no lactic acidosis and patient also had urinary retention.  Patient was given antibiotic, sent to cultures.  Hospitalist service was consulted for evaluation for admission  Subjective: Seen and examined today Overnight fever up to 101 last night, BP stable on room air Labs reviewed with hyponatremia hypokalemia WBC down  18>19>13 He has no new complaints, resting comfortably Voiding is less painful does have chronic issue with some retention at baseline  Assessment and plan:  Sepsis secondary to  UTI POA: Vitals remained stable, WBC downtrending, continue ceftriaxone  2 g follow-up blood and urine cultures. Continue Flomax >If retention persists then we may need to put the Foley catheter and possibly urology eval  Mild hyponatremia: Encourage p.o.  Type 2 diabetes A1c 5.7, continue sliding scale diabetic diet and hold metformin.  HTN HLD: BP remains stable, continue her amlodipine  10, HCTZ 25, lisinopril  and pravastatin   Hypothyroidism Continue levothyroxine  25 mcg daily   History of stroke Continue her aspirin  81 and statin  Hypokalemia: Replaced po  DVT prophylaxis: enoxaparin  (LOVENOX ) injection 40 mg Start: 11/27/23 2200 SCDs Start: 11/27/23 1414 Code Status:   Code Status: Full Code Family Communication: plan of care discussed with patien at  bedside. Patient status is: Remains hospitalized because of severity of illness Level of care: Telemetry Medical   Dispo: The patient is from: home            Anticipated disposition: Anticipate discharge in next 24 hours Objective: Vitals last 24 hrs: Vitals:   11/27/23 2238 11/27/23 2318 11/28/23 0359 11/28/23 0731  BP: 116/77 118/69 106/64 123/66  Pulse: (!) 104 88 73 79  Resp: (!) 28 (!) 24 (!) 22 18  Temp: 98.2 F (36.8 C)  98.9 F (37.2 C) 99.3 F (37.4 C)  TempSrc: Oral  Oral Oral  SpO2: 96% 97% 98% 97%  Weight:  88.7 kg    Height:  5' 8 (1.727 m)      Physical Examination: General exam: alert awake, oriented, older than stated age HEENT:Oral mucosa moist, Ear/Nose WNL grossly Respiratory system: Bilaterally clear BS,no use of accessory muscle Cardiovascular system: S1 & S2 +, No JVD. Gastrointestinal system: Abdomen soft,NT,ND, BS+ Nervous System: Alert, awake, moving all extremities,and following commands. Extremities: extremities warm, leg edema neg Skin: No rashes,no icterus. MSK: Normal muscle bulk,tone, power   Medications reviewed:  Scheduled Meds:  amLODipine   10 mg Oral Daily   aspirin   81 mg Oral Daily   enoxaparin  (LOVENOX ) injection  40 mg Subcutaneous QHS   hydrochlorothiazide   25 mg Oral Daily   insulin  aspart  0-5 Units Subcutaneous QHS   insulin  aspart  0-9 Units Subcutaneous TID WC   levothyroxine   25 mcg Oral Daily   lisinopril   40 mg Oral Daily   pravastatin   40 mg Oral Daily   tamsulosin   0.4 mg Oral Daily   Continuous Infusions:  cefTRIAXone  (ROCEPHIN )  IV     Diet: Diet Order             Diet regular Room service appropriate? Yes; Fluid consistency: Thin  Diet effective now                   Data Reviewed: I have personally reviewed following labs and imaging studies ( see epic result tab) CBC: Recent Labs  Lab 11/27/23 1054 11/27/23 1455 11/28/23 0540  WBC 18.2* 19.8* 13.2*  HGB 14.4 14.0 13.1  HCT 42.5 40.9 37.9*   MCV 92.8 92.1 91.1  PLT 233 238 206   CMP: Recent Labs  Lab 11/27/23 1054 11/27/23 1455 11/28/23 0540  NA 134*  --  132*  K 3.5  --  3.3*  CL 96*  --  97*  CO2 27  --  27  GLUCOSE 144*  --  130*  BUN 13  --  12  CREATININE 0.99 0.88 0.91  CALCIUM 9.2  --  8.5*   GFR: Estimated Creatinine Clearance: 77.1 mL/min (by C-G formula based on SCr of 0.91 mg/dL). Recent Labs  Lab 11/27/23 1054 11/28/23 0540  AST 15 15  ALT 12 12  ALKPHOS 66 55  BILITOT 1.1 0.9  PROT 7.7 6.6  ALBUMIN 4.1 3.3*    Recent Labs  Lab 11/27/23 1054  LIPASE 28   No results for input(s): AMMONIA in the last 168 hours. Coagulation Profile:  Recent Labs  Lab 11/28/23 0540  INR 1.2   Unresulted Labs (From admission, onward)     Start     Ordered   12/04/23 0500  Creatinine, serum  (enoxaparin  (LOVENOX )    CrCl >/= 30 ml/min)  Weekly,   STAT     Comments: while on enoxaparin  therapy    11/27/23 1414   11/27/23 1211  Urine Culture  Once,   R        11/27/23 1211           Antimicrobials/Microbiology: Anti-infectives (From admission, onward)    Start     Dose/Rate Route Frequency Ordered Stop   11/28/23 1200  cefTRIAXone  (ROCEPHIN ) 2 g in sodium chloride  0.9 % 100 mL IVPB        2 g 200 mL/hr over 30 Minutes Intravenous Every 24 hours 11/27/23 1443     11/27/23 1130  cefTRIAXone  (ROCEPHIN ) 2 g in sodium chloride  0.9 % 100 mL IVPB        2 g 200 mL/hr over 30 Minutes Intravenous Once 11/27/23 1122 11/27/23 1308         Component Value Date/Time   SDES BLOOD RIGHT ARM 11/27/2023 1145   SDES BLOOD LEFT AC 11/27/2023 1145   SPECREQUEST  11/27/2023 1145    BOTTLES DRAWN AEROBIC AND ANAEROBIC Blood Culture adequate volume   SPECREQUEST  11/27/2023 1145    BOTTLES DRAWN AEROBIC AND ANAEROBIC Blood Culture adequate volume   CULT  11/27/2023 1145    NO GROWTH < 24 HOURS Performed at [facility], [address] [place] [place], [place] [address]    CULT  11/27/2023 1145    NO  GROWTH < 24 HOURS Performed at [facility], [address] [place], [place] [address]    REPTSTATUS PENDING 11/27/2023 1145   REPTSTATUS PENDING 11/27/2023 1145    Procedures:  Mennie LAMY, MD [facility] 11/28/2023, 12:04 PM

## 2023-11-29 ENCOUNTER — Inpatient Hospital Stay

## 2023-11-29 DIAGNOSIS — N390 Urinary tract infection, site not specified: Secondary | ICD-10-CM | POA: Diagnosis not present

## 2023-11-29 DIAGNOSIS — A419 Sepsis, unspecified organism: Secondary | ICD-10-CM | POA: Diagnosis not present

## 2023-11-29 LAB — BASIC METABOLIC PANEL WITH GFR
Anion gap: 8 (ref 5–15)
BUN: 14 mg/dL (ref 8–23)
CO2: 25 mmol/L (ref 22–32)
Calcium: 8.2 mg/dL — ABNORMAL LOW (ref 8.9–10.3)
Chloride: 101 mmol/L (ref 98–111)
Creatinine, Ser: 0.81 mg/dL (ref 0.61–1.24)
GFR, Estimated: >60 mL/min (ref >60)
Glucose, Bld: 124 mg/dL — ABNORMAL HIGH (ref 70–99)
Potassium: 3.5 mmol/L (ref 3.5–5.1)
Sodium: 134 mmol/L — ABNORMAL LOW (ref 135–145)

## 2023-11-29 LAB — GLUCOSE, CAPILLARY
Glucose-Capillary: 147 mg/dL — ABNORMAL HIGH (ref 70–99)
Glucose-Capillary: 131 mg/dL — ABNORMAL HIGH (ref 70–99)
Glucose-Capillary: 192 mg/dL — ABNORMAL HIGH (ref 70–99)

## 2023-11-29 LAB — CBC
HCT: 35.5 % — ABNORMAL LOW (ref 39.0–52.0)
Hemoglobin: 12.2 g/dL — ABNORMAL LOW (ref 13.0–17.0)
MCH: 31.1 pg (ref 26.0–34.0)
MCHC: 34.4 g/dL (ref 30.0–36.0)
MCV: 90.6 fL (ref 80.0–100.0)
Platelets: 206 K/uL (ref 150–400)
RBC: 3.92 MIL/uL — ABNORMAL LOW (ref 4.22–5.81)
RDW: 12.1 % (ref 11.5–15.5)
WBC: 9.6 K/uL (ref 4.0–10.5)
nRBC: 0 % (ref 0.0–0.2)

## 2023-11-29 NOTE — Plan of Care (Signed)
  Problem: Education: Goal: Ability to describe self-care measures that may prevent or decrease complications (Diabetes Survival Skills Education) will improve Outcome: Progressing Goal: Individualized Educational Video(s) Outcome: Progressing   Problem: Coping: Goal: Ability to adjust to condition or change in health will improve Outcome: Progressing   Problem: Fluid Volume: Goal: Ability to maintain a balanced intake and output will improve Outcome: Progressing   Problem: Health Behavior/Discharge Planning: Goal: Ability to identify and utilize available resources and services will improve Outcome: Progressing Goal: Ability to manage health-related needs will improve Outcome: Progressing   Problem: Metabolic: Goal: Ability to maintain appropriate glucose levels will improve Outcome: Progressing   Problem: Nutritional: Goal: Maintenance of adequate nutrition will improve Outcome: Progressing Goal: Progress toward achieving an optimal weight will improve Outcome: Progressing   Problem: Skin Integrity: Goal: Risk for impaired skin integrity will decrease Outcome: Progressing   Problem: Tissue Perfusion: Goal: Adequacy of tissue perfusion will improve Outcome: Progressing   Problem: Education: Goal: Knowledge of General Education information will improve Description: Including pain rating scale, medication(s)/side effects and non-pharmacologic comfort measures Outcome: Progressing   Problem: Health Behavior/Discharge Planning: Goal: Ability to manage health-related needs will improve Outcome: Progressing   Problem: Clinical Measurements: Goal: Ability to maintain clinical measurements within normal limits will improve Outcome: Progressing Goal: Diagnostic test results will improve Outcome: Progressing Goal: Respiratory complications will improve Outcome: Progressing Goal: Cardiovascular complication will be avoided Outcome: Progressing

## 2023-11-29 NOTE — Plan of Care (Signed)

## 2023-11-29 NOTE — Progress Notes (Signed)
 PROGRESS NOTE Phillip Kent.  FMW:[id number] DOB: 12/26/1950 DOA: 11/27/2023 PCP: Phillip Ophelia JINNY DOUGLAS, MD  Brief Narrative/Hospital Course: Phillip Lemond Arthor Gorter. is a 73 y.o. male with PMH of prior stroke without any residual, HTN, HLD, DM who came into ED for dysuria and abnormal lab work at [facility] that was drawn due to patient's trouble urinating and chills/fever. Lab with leukocytosis tachycardia at doctor's office and sent to the ED ED Course: Upon arrival to the ED, patient is found to be tachycardic around 113, temperature 99 F, blood pressure 117/91, urine analysis was positive for UTI, leukocytosis at 18,000, no lactic acidosis and patient also had urinary retention.  Patient was given antibiotic, sent to cultures.  Hospitalist service was consulted for evaluation for admission  Subjective: Patient had no issues with voiding overnight. He has had a small bowel movement.  He denies nausea or vomiting.   Assessment and plan:  Sepsis secondary to  UTI POA: Now resolved. Afebrile.  No leukocytosis.  He underwent In-N-Out catheterization x 1.   Blood cultures are no growth at 2 days, urine cultures with GPC's Having some abdominal distention, no nausea or vomiting reportedly voiding appropriately. - Continue ceftriaxone  for now, monitor fever curve - Postvoid bladder scan and abdominal x-ray  Mild hyponatremia: Improving.  Continue to encourage p.o.  Possibly in setting of hydrochlorothiazide .  Given hyponatremia is mild we will continue hydrochlorothiazide .  Type 2 diabetes A1c 5.7, continue sliding scale diabetic diet and hold metformin.  HTN HLD: BP remains stable, continue amlodipine  10, HCTZ 25, lisinopril  and pravastatin   Hypothyroidism TSH 2 months ago within normal limits.  Continue levothyroxine  25 mcg daily   History of stroke Continue aspirin  81 and statin   DVT prophylaxis: enoxaparin  (LOVENOX ) injection 40 mg Start: 11/27/23 2200 SCDs Start:  11/27/23 1414 Code Status:   Code Status: Full Code Family Communication: plan of care discussed with patien at bedside. Patient status is: Remains hospitalized because of severity of illness Level of care: Telemetry Medical   Dispo: The patient is from: home            Anticipated disposition: Anticipate discharge in next 24 hours Objective: Vitals last 24 hrs: Vitals:   11/27/23 2238 11/27/23 2318 11/28/23 0359 11/28/23 0731  BP: 116/77 118/69 106/64 123/66  Pulse: (!) 104 88 73 79  Resp: (!) 28 (!) 24 (!) 22 18  Temp: 98.2 F (36.8 C)  98.9 F (37.2 C) 99.3 F (37.4 C)  TempSrc: Oral  Oral Oral  SpO2: 96% 97% 98% 97%  Weight:  88.7 kg    Height:  5' 8 (1.727 m)      Physical Exam  Constitutional: In no distress.  Cardiovascular: Normal rate, regular rhythm. No lower extremity edema  Pulmonary: Non labored breathing on room air, no wheezing or rales.   Abdominal: Soft.  Mildly distended, nontender Musculoskeletal: Normal range of motion.     Neurological: Alert and oriented to person, place, and time. Non focal  Skin: Skin is warm and dry.    Medications reviewed:  Scheduled Meds:  amLODipine   10 mg Oral Daily   aspirin   81 mg Oral Daily   enoxaparin  (LOVENOX ) injection  40 mg Subcutaneous QHS   hydrochlorothiazide   25 mg Oral Daily   insulin  aspart  0-5 Units Subcutaneous QHS   insulin  aspart  0-9 Units Subcutaneous TID WC   levothyroxine   25 mcg Oral Daily   lisinopril   40 mg Oral Daily  pravastatin   40 mg Oral Daily   tamsulosin   0.4 mg Oral Daily   Continuous Infusions:  cefTRIAXone  (ROCEPHIN )  IV     Diet: Diet Order             Diet regular Room service appropriate? Yes; Fluid consistency: Thin  Diet effective now                   Data Reviewed: I have personally reviewed following labs and imaging studies ( see epic result tab) CBC: Recent Labs  Lab 11/27/23 1054 11/27/23 1455 11/28/23 0540 11/29/23 0548  WBC 18.2* 19.8* 13.2* 9.6   HGB 14.4 14.0 13.1 12.2*  HCT 42.5 40.9 37.9* 35.5*  MCV 92.8 92.1 91.1 90.6  PLT 233 238 206 206   CMP: Recent Labs  Lab 11/27/23 1054 11/27/23 1455 11/28/23 0540 11/29/23 0548  NA 134*  --  132* 134*  K 3.5  --  3.3* 3.5  CL 96*  --  97* 101  CO2 27  --  27 25  GLUCOSE 144*  --  130* 124*  BUN 13  --  12 14  CREATININE 0.99 0.88 0.91 0.81  CALCIUM 9.2  --  8.5* 8.2*   GFR: Estimated Creatinine Clearance: 86.6 mL/min (by C-G formula based on SCr of 0.81 mg/dL). Recent Labs  Lab 11/27/23 1054 11/28/23 0540  AST 15 15  ALT 12 12  ALKPHOS 66 55  BILITOT 1.1 0.9  PROT 7.7 6.6  ALBUMIN 4.1 3.3*    Recent Labs  Lab 11/27/23 1054  LIPASE 28   No results for input(s): AMMONIA in the last 168 hours. Coagulation Profile:  Recent Labs  Lab 11/28/23 0540  INR 1.2   Unresulted Labs (From admission, onward)     Start     Ordered   12/04/23 0500  Creatinine, serum  (enoxaparin  (LOVENOX )    CrCl >/= 30 ml/min)  Weekly,   STAT     Comments: while on enoxaparin  therapy    11/27/23 1414   11/29/23 0500  Basic metabolic panel with GFR  Daily,   R      11/28/23 1205   11/29/23 0500  CBC  Daily,   R      11/28/23 1205           Antimicrobials/Microbiology: Anti-infectives (From admission, onward)    Start     Dose/Rate Route Frequency Ordered Stop   11/28/23 1200  cefTRIAXone  (ROCEPHIN ) 2 g in sodium chloride  0.9 % 100 mL IVPB        2 g 200 mL/hr over 30 Minutes Intravenous Every 24 hours 11/27/23 1443     11/27/23 1130  cefTRIAXone  (ROCEPHIN ) 2 g in sodium chloride  0.9 % 100 mL IVPB        2 g 200 mL/hr over 30 Minutes Intravenous Once 11/27/23 1122 11/27/23 1308         Component Value Date/Time   SDES  11/27/2023 1211    URINE, RANDOM Performed at [facility], [address], [place], [place] [address]    [facility]  11/27/2023 1211    NONE Reflexed from [id number] Performed at [facility], [address] [place], [place]  [address]    CULT (A) 11/27/2023 1211    40,000 COLONIES/mL GRAM POSITIVE COCCI IDENTIFICATION AND SUSCEPTIBILITIES TO FOLLOW Performed at [facility], [address] [place], [place] [address]    REPTSTATUS PENDING 11/27/2023 1211    Procedures:  Alban Pepper,  MD [facility] 11/29/2023, 2:15 PM

## 2023-11-29 NOTE — Evaluation (Signed)
 Physical Therapy Evaluation Patient Details Name: Chayton Murata. MRN: [id number] DOB: [birth year] Today's Date: 11/29/2023  History of Present Illness  Pt is a 73 y/o M presenting to ED with c/o dysuria, burning with urination, and fever. Workup for sepsis 2/2 UTI. PMH significant for HTN, prior CVA without residual deficits, DM, HLD.  Clinical Impression  Pt A&Ox4, agreeable to PT evaluation, denied pain throughout session. At baseline, pt is IND with mobility/ADLs, no previous AD use, denies hx of falls. Pt was met supine in bed, supervision for all bed mobility/transfers. Pt amb ~364ft with no AD, no LOB and minimal postural sway throughout. DGI assessed during ambulation, pt demonstrated increased difficulty with vertical head turns, change in gait speed, and stepping over obstacles- DGI score reflects that pt is not at increased risk of falls. Pt was left seated in recliner at end of session, all needs within reach. Pt appears to be at his baseline mobility with no additional PT needs at this time, PT to sign off.      If plan is discharge home, recommend the following: Assist for transportation;Help with stairs or ramp for entrance   Can travel by private vehicle        Equipment Recommendations None recommended by PT  Recommendations for Other Services       Functional Status Assessment Patient has had a recent decline in their functional status and demonstrates the ability to make significant improvements in function in a reasonable and predictable amount of time.     Precautions / Restrictions Precautions Precautions: Fall Recall of Precautions/Restrictions: Impaired Restrictions Weight Bearing Restrictions Per Provider Order: No      Mobility  Bed Mobility Overal bed mobility: Needs Assistance Bed Mobility: Supine to Sit     Supine to sit: Supervision, HOB elevated, Used rails     General bed mobility comments: no physical assist, use of bed features     Transfers Overall transfer level: Needs assistance Equipment used: None Transfers: Sit to/from Stand Sit to Stand: Supervision           General transfer comment: no physical assist for STS from EOB    Ambulation/Gait Ambulation/Gait assistance: Contact guard assist Gait Distance (Feet): 350 Feet Assistive device: None Gait Pattern/deviations: Step-through pattern, Narrow base of support       General Gait Details: no LOB, minimal postural sway, decreased arm swing bilaterally  Stairs            Wheelchair Mobility     Tilt Bed    Modified Rankin (Stroke Patients Only)       Balance Overall balance assessment: Needs assistance Sitting-balance support: Feet supported Sitting balance-Leahy Scale: Normal Sitting balance - Comments: steady static and dynamic sitting   Standing balance support: No upper extremity supported Standing balance-Leahy Scale: Good Standing balance comment: able to stand at sink and wash hands with supervision                     Dynamic Gait Index Level Surface: Normal Change in Gait Speed: Mild Impairment Gait with Horizontal Head Turns: Normal Gait with Vertical Head Turns: Mild Impairment Gait and Pivot Turn: Normal Step Over Obstacle: Mild Impairment Step Around Obstacles: Normal Steps: Mild Impairment Total Score: 20       Pertinent Vitals/Pain Pain Assessment Pain Assessment: No/denies pain    Home Living Family/patient expects to be discharged to:: Private residence Living Arrangements: Alone Available Help at Discharge: Family;Available PRN/intermittently Type of Home: House  Home Access: Stairs to enter Entrance Stairs-Rails: None Entrance Stairs-Number of Steps: 2   Home Layout: One level Home Equipment: Shower seat      Prior Function Prior Level of Function : Independent/Modified Independent;Driving             Mobility Comments: IND with mobility, no previous AD use, denies hx of  falls. States he is very active and walks 3-4 miles a day ADLs Comments: IND with ADLs     Extremity/Trunk Assessment        Lower Extremity Assessment Lower Extremity Assessment: Overall WFL for tasks assessed       Communication   Communication Communication: Impaired Factors Affecting Communication: Hearing impaired    Cognition Arousal: Alert Behavior During Therapy: WFL for tasks assessed/performed, Impulsive   PT - Cognitive impairments: No apparent impairments                       PT - Cognition Comments: A&Ox4 Following commands: Intact       Cueing Cueing Techniques: Verbal cues, Visual cues     General Comments      Exercises     Assessment/Plan    PT Assessment Patient does not need any further PT services  PT Problem List         PT Treatment Interventions      PT Goals (Current goals can be found in the Care Plan section)  Acute Rehab PT Goals Patient Stated Goal: to go home PT Goal Formulation: All assessment and education complete, DC therapy    Frequency       Co-evaluation               AM-PAC PT 6 Clicks Mobility  Outcome Measure Help needed turning from your back to your side while in a flat bed without using bedrails?: None Help needed moving from lying on your back to sitting on the side of a flat bed without using bedrails?: A Little Help needed moving to and from a bed to a chair (including a wheelchair)?: None Help needed standing up from a chair using your arms (e.g., wheelchair or bedside chair)?: None Help needed to walk in hospital room?: None Help needed climbing 3-5 steps with a railing? : None 6 Click Score: 23    End of Session Equipment Utilized During Treatment: Gait belt Activity Tolerance: Patient tolerated treatment well Patient left: in chair;with call bell/phone within reach;with chair alarm set Nurse Communication: Mobility status PT Visit Diagnosis: Other abnormalities of gait and  mobility (R26.89)    Time: 1355-1410 PT Time Calculation (min) (ACUTE ONLY): 15 min   Charges:   PT Evaluation $PT Eval Low Complexity: 1 Low   PT General Charges $$ ACUTE PT VISIT: 1 Visit         Janell Axe, SPT

## 2023-11-30 ENCOUNTER — Other Ambulatory Visit: Payer: Self-pay

## 2023-11-30 DIAGNOSIS — B952 Enterococcus as the cause of diseases classified elsewhere: Secondary | ICD-10-CM | POA: Diagnosis not present

## 2023-11-30 DIAGNOSIS — N390 Urinary tract infection, site not specified: Secondary | ICD-10-CM | POA: Diagnosis not present

## 2023-11-30 LAB — URINE CULTURE: Culture: 40000 — AB

## 2023-11-30 LAB — BASIC METABOLIC PANEL WITH GFR
Anion gap: 8 (ref 5–15)
BUN: 14 mg/dL (ref 8–23)
CO2: 27 mmol/L (ref 22–32)
Calcium: 8.8 mg/dL — ABNORMAL LOW (ref 8.9–10.3)
Chloride: 99 mmol/L (ref 98–111)
Creatinine, Ser: 0.83 mg/dL (ref 0.61–1.24)
GFR, Estimated: >60 mL/min (ref >60)
Glucose, Bld: 133 mg/dL — ABNORMAL HIGH (ref 70–99)
Potassium: 3.5 mmol/L (ref 3.5–5.1)
Sodium: 134 mmol/L — ABNORMAL LOW (ref 135–145)

## 2023-11-30 LAB — CBC
HCT: 36.3 % — ABNORMAL LOW (ref 39.0–52.0)
Hemoglobin: 12.7 g/dL — ABNORMAL LOW (ref 13.0–17.0)
MCH: 31.7 pg (ref 26.0–34.0)
MCHC: 35 g/dL (ref 30.0–36.0)
MCV: 90.5 fL (ref 80.0–100.0)
Platelets: 235 K/uL (ref 150–400)
RBC: 4.01 MIL/uL — ABNORMAL LOW (ref 4.22–5.81)
RDW: 12 % (ref 11.5–15.5)
WBC: 7.2 K/uL (ref 4.0–10.5)
nRBC: 0 % (ref 0.0–0.2)

## 2023-11-30 MED ORDER — ALBUTEROL SULFATE HFA 108 (90 BASE) MCG/ACT IN AERS
2.0000 | INHALATION_SPRAY | Freq: Four times a day (QID) | RESPIRATORY_TRACT | 2 refills | Status: AC | PRN
  Filled 2023-11-30: qty 6.7, 30d supply, fill #0

## 2023-11-30 MED ORDER — TAMSULOSIN HCL 0.4 MG PO CAPS
0.4000 mg | ORAL_CAPSULE | Freq: Every day | ORAL | 0 refills | Status: AC
  Filled 2023-11-30: qty 30, 30d supply, fill #0

## 2023-11-30 MED ORDER — AMOXICILLIN 500 MG PO CAPS
500.0000 mg | ORAL_CAPSULE | Freq: Three times a day (TID) | ORAL | 0 refills | Status: AC
  Filled 2023-11-30: qty 15, 5d supply, fill #0

## 2023-11-30 MED ORDER — LEVOTHYROXINE SODIUM 25 MCG PO TABS
25.0000 ug | ORAL_TABLET | Freq: Every day | ORAL | 0 refills | Status: AC
  Filled 2023-11-30: qty 30, 30d supply, fill #0

## 2023-11-30 NOTE — TOC Initial Note (Signed)
 Transition of Care (TOC) - Initial/Assessment Note    Patient Details  Name: Phillip Kent. MRN: [id number] Date of Birth: 1950/05/17  Transition of Care [facility]) CM/SW Contact:    Dalia GORMAN Fuse, RN Phone Number: 11/30/2023, 11:02 AM  Clinical Narrative:                    No TOC needs, please outreach to [facility] if needs identified.     Patient Goals and CMS Choice            Expected Discharge Plan and Services                                              Prior Living Arrangements/Services                       Activities of Daily Living      Permission Sought/Granted                  Emotional Assessment              Admission diagnosis:  UTI (urinary tract infection) [N39.0] Urinary tract infection without hematuria, site unspecified [N39.0] Sepsis, due to unspecified organism, unspecified whether acute organ dysfunction present [facility]) [A41.9] Patient Active Problem List   Diagnosis Date Noted   UTI (urinary tract infection) 11/27/2023   HLD (hyperlipidemia) 11/27/2023   Vertigo 10/20/2020   Psoriasis 10/20/2020   Microscopic hematuria 10/20/2020   Hypertension 10/20/2020   Renal artery aneurysm (HCC) 10/06/2020   Onychomycosis of multiple toenails with type 2 diabetes mellitus (HCC) 01/19/2017   Aortic atherosclerosis (HCC) 08/04/2016   Coronary artery calcification seen on CT scan 07/18/2016   Personal history of tobacco use, presenting hazards to health 07/17/2015   Personal history of nicotine dependence 07/17/2015   History of stroke 02/24/2015   Tobacco abuse 07/23/2014   Type 2 diabetes mellitus with diabetic nephropathy (HCC) 12/02/2013   Proteinuria 12/02/2013   PCP:  Fernande Ophelia JINNY DOUGLAS, MD Pharmacy:   [id number] [place], [place] - [address] AT [employer] [address] [place] [address] Phone: [phone] Fax: [phone]     Social Drivers  of Health (SDOH) Social History: SDOH Screenings   Food Insecurity: No Food Insecurity (11/28/2023)  Housing: Low Risk  (11/28/2023)  Transportation Needs: No Transportation Needs (11/28/2023)  Utilities: Not At Risk (11/28/2023)  Financial Resource Strain: Low Risk  (08/16/2023)   Received from [facility]  Social Connections: Unknown (11/29/2023)  Tobacco Use: Medium Risk (11/27/2023)   SDOH Interventions:     Readmission Risk Interventions     No data to display

## 2023-11-30 NOTE — Progress Notes (Signed)
   11/30/23 1100  Mobility  Activity Ambulated independently  Level of Assistance Independent after set-up  Assistive Device None  Distance Ambulated (ft) 50 ft  Range of Motion/Exercises Active Assistive  Activity Response Tolerated well  Mobility visit 1 Mobility  Mobility Specialist Start Time (ACUTE ONLY) 1137  Mobility Specialist Stop Time (ACUTE ONLY) 1141  Mobility Specialist Time Calculation (min) (ACUTE ONLY) 4 min   Mobility Specialist - Progress Note  Pt was supine in the bed upon entry. Pt agreed to mobility. Pt was able to EOB independently. Pt was able to STS independently. Pt was able to ambulate to the bathroom no CGA. Pt after activity returned to the bed in supine position.  Clem Rodes Mobility Specialist 11/30/23, 11:46 AM

## 2023-11-30 NOTE — Discharge Instructions (Addendum)
 Need to follow up with PCP regarding which dose of synthroid  you should take. You were discharged on 25mcg a day.

## 2023-11-30 NOTE — Discharge Summary (Signed)
 Physician Discharge Summary   Patient: Phillip Kent. MRN: [id number] DOB: 1950/06/04  Admit date:     11/27/2023  Discharge date: 11/30/2023  Discharge Physician: Phillip Kent   PCP: Phillip Kent   Recommendations at discharge:    Cbc to ensure hemoglobin stable  Ensure patient on appropriate levothyroxine  dose. Discharged on 25mcg daily   Discharge Diagnoses: Principal Problem:   UTI (urinary tract infection) Active Problems:   Type 2 diabetes mellitus with diabetic nephropathy (HCC)   Hypertension   History of stroke   HLD (hyperlipidemia)  Resolved Problems:   * No resolved hospital problems. *  Hospital Course: Phillip Schoenberger. is a 73 y.o. male with PMH of prior stroke without any residual, HTN, HLD, DM who came into ED for dysuria and abnormal lab work at [facility] that was drawn due to patient's trouble urinating and chills/fever. Lab with leukocytosis tachycardia at doctor's office and sent to the ED ED Course: Upon arrival to the ED, patient is found to be tachycardic around 113, temperature 99 F, blood pressure 117/91, urine analysis was positive for UTI, leukocytosis at 18,000, no lactic acidosis and patient also had urinary retention.  Patient was given antibiotic, sent to cultures.  Hospitalist service was consulted for evaluation for admission   Assessment and plan:  Sepsis secondary to  UTI POA: Sepsis physiology resolved. Patient had some urinary retention and required a I&O catheterization. He was started on flomax  and had no further episodes of urinary retention. Bcx were no growth. UCX grew ENTEROCOCCUS FAECALIS. He was discharged on a 5 d amoxicillin  course as ceftriaxone  did not cover this.    Mild hyponatremia: Mild and asymptomatic.    Type 2 diabetes A1c 5.7, metformin continued on discharge.    HTN HLD: Home antihypertensives and statin continued.    Hypothyroidism TSH 2 months ago within normal limits.   Continued on levothyroxine  25 mcg daily   History of stroke No residual deficits. Continue aspirin  81 and statin         Consultants: None Procedures performed: None  Disposition: Home Diet recommendation:  Discharge Diet Orders (From admission, onward)     Start     Ordered   11/30/23 0000  Diet general        11/30/23 1354           Regular diet DISCHARGE MEDICATION: Allergies as of 11/30/2023   No Known Allergies      Medication List     TAKE these medications    albuterol  108 (90 Base) MCG/ACT inhaler Commonly known as: VENTOLIN  HFA Inhale 2 puffs into the lungs every 6 (six) hours as needed for wheezing or shortness of breath.   amLODipine  10 MG tablet Commonly known as: NORVASC  Take 10 mg by mouth daily.   amoxicillin  500 MG capsule Commonly known as: AMOXIL  Take 1 capsule (500 mg total) by mouth 3 (three) times daily for 5 days.   ASPIRIN  81 PO Take by mouth daily.   DropSafe Alcohol  Prep 70 % Pads Apply topically.   Fish Oil 1000 MG Caps Take 2,000 mg by mouth in the morning.   hydrochlorothiazide  25 MG tablet Commonly known as: HYDRODIURIL  Take 25 mg by mouth daily.   ipratropium 0.06 % nasal spray Commonly known as: ATROVENT Place 2 sprays into the nose 3 (three) times daily as needed for rhinitis.   levothyroxine  25 MCG tablet Commonly known as: SYNTHROID  Take 1 tablet (25 mcg total) by  mouth daily. What changed: Another medication with the same name was removed. Continue taking this medication, and follow the directions you see here.   lisinopril  40 MG tablet Commonly known as: ZESTRIL  Take 40 mg by mouth daily.   metFORMIN 500 MG 24 hr tablet Commonly known as: GLUCOPHAGE-XR Take 500 mg by mouth daily.   pravastatin  40 MG tablet Commonly known as: PRAVACHOL  Take 40 mg by mouth daily.   tamsulosin  0.4 MG Caps capsule Commonly known as: FLOMAX  Take 1 capsule (0.4 mg total) by mouth daily. Start taking on: December 01, 2023        Follow-up Information     [facility] up.   Specialty: Internal Medicine Why: hospital follow up Contact information: [address] [place] [address] [phone]                Discharge Exam:    11/30/2023   10:02 AM 11/30/2023    4:43 AM 11/29/2023    7:56 PM  Vitals with BMI  Systolic 120 109 874  Diastolic 64 70 87  Pulse 65 66 87     Physical Exam  Constitutional: In no distress.  Cardiovascular: Normal rate, regular rhythm. No lower extremity edema  Pulmonary: Non labored breathing on room air, no wheezing or rales.   Abdominal: Soft. Mildly distended and non tender Musculoskeletal: Normal range of motion.     Neurological: Alert and oriented to person, place, and time. Non focal  Skin: Skin is warm and dry.    Condition at discharge: good  The results of significant diagnostics from this hospitalization (including imaging, microbiology, ancillary and laboratory) are listed below for reference.   Imaging Studies: DG Abd 1 View Result Date: 11/29/2023 CLINICAL DATA:  Abdominal distension.  UTI. EXAM: ABDOMEN - 1 VIEW COMPARISON:  CT dated 07/14/2021. FINDINGS: There is no bowel dilatation or evidence of obstruction. There is noted in the colon. No free air. Degenerative changes of the spine. No acute osseous pathology. IMPRESSION: Nonobstructive bowel gas pattern. Electronically Signed   By: Phillip Kent M.D.   On: 11/29/2023 18:39    Microbiology: Results for orders placed or performed during the hospital encounter of 11/27/23  Resp panel by RT-PCR (RSV, Flu A&B, Covid) Anterior Nasal Swab     Status: None   Collection Time: 11/27/23 11:45 AM   Specimen: Anterior Nasal Swab  Result Value Ref Range Status   SARS Coronavirus 2 by RT PCR NEGATIVE NEGATIVE Final    Comment: (NOTE) SARS-CoV-2 target nucleic acids are NOT DETECTED.  The SARS-CoV-2 RNA is generally detectable in upper respiratory specimens during  the acute phase of infection. The lowest concentration of SARS-CoV-2 viral copies this assay can detect is 138 copies/mL. A negative result does not preclude SARS-Cov-2 infection and should not be used as the sole basis for treatment or other patient management decisions. A negative result may occur with  improper specimen collection/handling, submission of specimen other than nasopharyngeal swab, presence of viral mutation(s) within the areas targeted by this assay, and inadequate number of viral copies(<138 copies/mL). A negative result must be combined with clinical observations, patient history, and epidemiological information. The expected result is Negative.  Fact Sheet for Patients:  BloggerCourse.com  Fact Sheet for Healthcare Providers:  SeriousBroker.it  This test is no t yet approved or cleared by the [place]  FDA and  has been authorized for detection and/or diagnosis of SARS-CoV-2 by FDA under an Emergency Use Authorization (  EUA). This EUA will remain  in effect (meaning this test can be used) for the duration of the COVID-19 declaration under Section 564(b)(1) of the Act, 21 U.S.C.section 360bbb-3(b)(1), unless the authorization is terminated  or revoked sooner.       Influenza A by PCR NEGATIVE NEGATIVE Final   Influenza B by PCR NEGATIVE NEGATIVE Final    Comment: (NOTE) The Xpert Xpress SARS-CoV-2/FLU/RSV plus assay is intended as an aid in the diagnosis of influenza from Nasopharyngeal swab specimens and should not be used as a sole basis for treatment. Nasal washings and aspirates are unacceptable for Xpert Xpress SARS-CoV-2/FLU/RSV testing.  Fact Sheet for Patients: BloggerCourse.com  Fact Sheet for Healthcare Providers: SeriousBroker.it  This test is not yet approved or cleared by the [place]  FDA and has been authorized for detection and/or  diagnosis of SARS-CoV-2 by FDA under an Emergency Use Authorization (EUA). This EUA will remain in effect (meaning this test can be used) for the duration of the COVID-19 declaration under Section 564(b)(1) of the Act, 21 U.S.C. section 360bbb-3(b)(1), unless the authorization is terminated or revoked.     Resp Syncytial Virus by PCR NEGATIVE NEGATIVE Final    Comment: (NOTE) Fact Sheet for Patients: BloggerCourse.com  Fact Sheet for Healthcare Providers: SeriousBroker.it  This test is not yet approved or cleared by the [place]  FDA and has been authorized for detection and/or diagnosis of SARS-CoV-2 by FDA under an Emergency Use Authorization (EUA). This EUA will remain in effect (meaning this test can be used) for the duration of the COVID-19 declaration under Section 564(b)(1) of the Act, 21 U.S.C. section 360bbb-3(b)(1), unless the authorization is terminated or revoked.  Performed at [facility], [address] [place], [place] [address]   Blood Culture (routine x 2)     Status: None (Preliminary result)   Collection Time: 11/27/23 11:45 AM   Specimen: BLOOD  Result Value Ref Range Status   Specimen Description BLOOD RIGHT ARM  Final   Special Requests   Final    BOTTLES DRAWN AEROBIC AND ANAEROBIC Blood Culture adequate volume   Culture   Final    NO GROWTH 3 DAYS Performed at [facility], [address] [place], [place] [address]    Report Status PENDING  Incomplete  Blood Culture (routine x 2)     Status: None (Preliminary result)   Collection Time: 11/27/23 11:45 AM   Specimen: BLOOD  Result Value Ref Range Status   Specimen Description BLOOD LEFT AC  Final   Special Requests   Final    BOTTLES DRAWN AEROBIC AND ANAEROBIC Blood Culture adequate volume   Culture   Final    NO GROWTH 3 DAYS Performed at [facility], [address] [place], [place] [address]    Report  Status PENDING  Incomplete  Urine Culture     Status: Abnormal   Collection Time: 11/27/23 12:11 PM   Specimen: Urine, Random  Result Value Ref Range Status   Specimen Description   Final    URINE, RANDOM Performed at [facility], [address] [place], [place] [address]    Special Requests   Final    NONE Reflexed from [id number] Performed at [facility], [address] [place], [place] [address]    Culture 40,000 COLONIES/mL ENTEROCOCCUS FAECALIS (A)  Final   Report Status 11/30/2023 FINAL  Final   Organism ID, Bacteria ENTEROCOCCUS FAECALIS (A)  Final      Susceptibility  Enterococcus faecalis - MIC*    AMPICILLIN <=2 SENSITIVE Sensitive     NITROFURANTOIN <=16 SENSITIVE Sensitive     VANCOMYCIN 2 SENSITIVE Sensitive     * 40,000 COLONIES/mL ENTEROCOCCUS FAECALIS    Labs: CBC: Recent Labs  Lab 11/27/23 1054 11/27/23 1455 11/28/23 0540 11/29/23 0548 11/30/23 0528  WBC 18.2* 19.8* 13.2* 9.6 7.2  HGB 14.4 14.0 13.1 12.2* 12.7*  HCT 42.5 40.9 37.9* 35.5* 36.3*  MCV 92.8 92.1 91.1 90.6 90.5  PLT 233 238 206 206 235   Basic Metabolic Panel: Recent Labs  Lab 11/27/23 1054 11/27/23 1455 11/28/23 0540 11/29/23 0548 11/30/23 0528  NA 134*  --  132* 134* 134*  K 3.5  --  3.3* 3.5 3.5  CL 96*  --  97* 101 99  CO2 27  --  27 25 27   GLUCOSE 144*  --  130* 124* 133*  BUN 13  --  12 14 14   CREATININE 0.99 0.88 0.91 0.81 0.83  CALCIUM 9.2  --  8.5* 8.2* 8.8*   Liver Function Tests: Recent Labs  Lab 11/27/23 1054 11/28/23 0540  AST 15 15  ALT 12 12  ALKPHOS 66 55  BILITOT 1.1 0.9  PROT 7.7 6.6  ALBUMIN 4.1 3.3*   CBG: Recent Labs  Lab 11/28/23 1651 11/28/23 2029 11/29/23 0734 11/29/23 1139 11/29/23 1554  GLUCAP 162* 233* 147* 131* 192*    Discharge time spent: greater than 30 minutes.  Signed: Alban Pepper, [facility] 11/30/2023

## 2023-11-30 NOTE — Care Management Important Message (Signed)
 Important Message  Patient Details  Name: Phillip Kent. MRN: [id number] Date of Birth: 09-29-1950   Important Message Given:  Yes - Medicare IM     Kinlee Garrison W, CMA 11/30/2023, 10:07 AM

## 2023-12-02 LAB — CULTURE, BLOOD (ROUTINE X 2)
Culture: NO GROWTH
Culture: NO GROWTH
Special Requests: ADEQUATE
Special Requests: ADEQUATE

## 2023-12-05 DIAGNOSIS — E039 Hypothyroidism, unspecified: Secondary | ICD-10-CM | POA: Diagnosis not present

## 2023-12-05 DIAGNOSIS — N390 Urinary tract infection, site not specified: Secondary | ICD-10-CM | POA: Diagnosis not present

## 2023-12-05 DIAGNOSIS — J069 Acute upper respiratory infection, unspecified: Secondary | ICD-10-CM | POA: Diagnosis not present

## 2023-12-05 DIAGNOSIS — Z09 Encounter for follow-up examination after completed treatment for conditions other than malignant neoplasm: Secondary | ICD-10-CM | POA: Diagnosis not present

## 2023-12-05 DIAGNOSIS — Z03818 Encounter for observation for suspected exposure to other biological agents ruled out: Secondary | ICD-10-CM | POA: Diagnosis not present

## 2024-02-10 ENCOUNTER — Other Ambulatory Visit (HOSPITAL_COMMUNITY): Payer: Self-pay

## 2024-02-13 ENCOUNTER — Other Ambulatory Visit (HOSPITAL_COMMUNITY): Payer: Self-pay
